# Patient Record
Sex: Female | Born: 1960 | Race: White | Hispanic: No | Smoking: Former smoker
Health system: Southern US, Community
[De-identification: ages and names within clinical notes are randomized; demographics above are authoritative.]

## PROBLEM LIST (undated history)

## (undated) DIAGNOSIS — R5383 Other fatigue: Secondary | ICD-10-CM

## (undated) DIAGNOSIS — K760 Fatty (change of) liver, not elsewhere classified: Secondary | ICD-10-CM

## (undated) DIAGNOSIS — L509 Urticaria, unspecified: Secondary | ICD-10-CM

## (undated) DIAGNOSIS — E669 Obesity, unspecified: Secondary | ICD-10-CM

## (undated) DIAGNOSIS — R002 Palpitations: Secondary | ICD-10-CM

## (undated) DIAGNOSIS — Z923 Personal history of irradiation: Secondary | ICD-10-CM

## (undated) DIAGNOSIS — Z9221 Personal history of antineoplastic chemotherapy: Secondary | ICD-10-CM

## (undated) DIAGNOSIS — R0683 Snoring: Secondary | ICD-10-CM

## (undated) DIAGNOSIS — E785 Hyperlipidemia, unspecified: Secondary | ICD-10-CM

## (undated) DIAGNOSIS — G4700 Insomnia, unspecified: Secondary | ICD-10-CM

## (undated) DIAGNOSIS — I10 Essential (primary) hypertension: Secondary | ICD-10-CM

## (undated) DIAGNOSIS — C50919 Malignant neoplasm of unspecified site of unspecified female breast: Secondary | ICD-10-CM

## (undated) DIAGNOSIS — R600 Localized edema: Secondary | ICD-10-CM

## (undated) DIAGNOSIS — D039 Melanoma in situ, unspecified: Secondary | ICD-10-CM

## (undated) DIAGNOSIS — Z460 Encounter for fitting and adjustment of spectacles and contact lenses: Secondary | ICD-10-CM

## (undated) DIAGNOSIS — R197 Diarrhea, unspecified: Secondary | ICD-10-CM

## (undated) HISTORY — DX: Localized edema: R60.0

## (undated) HISTORY — PX: BREAST BIOPSY: SHX20

## (undated) HISTORY — DX: Fatty (change of) liver, not elsewhere classified: K76.0

## (undated) HISTORY — DX: Malignant neoplasm of unspecified site of unspecified female breast: C50.919

## (undated) HISTORY — DX: Other fatigue: R53.83

## (undated) HISTORY — DX: Obesity, unspecified: E66.9

## (undated) HISTORY — PX: DILATION AND CURETTAGE OF UTERUS: SHX78

## (undated) HISTORY — DX: Urticaria, unspecified: L50.9

## (undated) HISTORY — DX: Hyperlipidemia, unspecified: E78.5

## (undated) HISTORY — DX: Diarrhea, unspecified: R19.7

## (undated) HISTORY — DX: Essential (primary) hypertension: I10

## (undated) HISTORY — PX: BREAST LUMPECTOMY: SHX2

## (undated) HISTORY — DX: Palpitations: R00.2

## (undated) HISTORY — DX: Insomnia, unspecified: G47.00

## (undated) HISTORY — PX: TUBAL LIGATION: SHX77

---
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
## 2001-10-05 ENCOUNTER — Encounter: Payer: Self-pay | Admitting: Emergency Medicine

## 2001-10-05 ENCOUNTER — Encounter (INDEPENDENT_AMBULATORY_CARE_PROVIDER_SITE_OTHER): Payer: Self-pay

## 2001-10-05 ENCOUNTER — Observation Stay (HOSPITAL_COMMUNITY): Admission: AD | Admit: 2001-10-05 | Discharge: 2001-10-06 | Payer: Self-pay | Admitting: Obstetrics and Gynecology

## 2001-11-13 HISTORY — PX: ECTOPIC PREGNANCY SURGERY: SHX613

## 2002-02-04 ENCOUNTER — Inpatient Hospital Stay (HOSPITAL_COMMUNITY): Admission: AD | Admit: 2002-02-04 | Discharge: 2002-02-04 | Payer: Self-pay | Admitting: Obstetrics and Gynecology

## 2002-02-26 ENCOUNTER — Encounter: Payer: Self-pay | Admitting: Family Medicine

## 2002-02-26 ENCOUNTER — Encounter: Admission: RE | Admit: 2002-02-26 | Discharge: 2002-02-26 | Payer: Self-pay | Admitting: Family Medicine

## 2002-03-18 ENCOUNTER — Encounter: Payer: Self-pay | Admitting: Obstetrics and Gynecology

## 2002-03-18 ENCOUNTER — Ambulatory Visit (HOSPITAL_COMMUNITY): Admission: RE | Admit: 2002-03-18 | Discharge: 2002-03-18 | Payer: Self-pay | Admitting: Obstetrics and Gynecology

## 2004-09-15 ENCOUNTER — Ambulatory Visit (HOSPITAL_COMMUNITY): Admission: RE | Admit: 2004-09-15 | Discharge: 2004-09-15 | Payer: Self-pay | Admitting: Family Medicine

## 2004-12-12 ENCOUNTER — Other Ambulatory Visit: Admission: RE | Admit: 2004-12-12 | Discharge: 2004-12-12 | Payer: Self-pay | Admitting: Obstetrics and Gynecology

## 2005-03-01 ENCOUNTER — Encounter: Admission: RE | Admit: 2005-03-01 | Discharge: 2005-03-01 | Payer: Self-pay | Admitting: Obstetrics and Gynecology

## 2006-11-13 HISTORY — PX: ABDOMINAL HYSTERECTOMY: SHX81

## 2007-05-01 ENCOUNTER — Ambulatory Visit (HOSPITAL_COMMUNITY): Admission: RE | Admit: 2007-05-01 | Discharge: 2007-05-02 | Payer: Self-pay | Admitting: Obstetrics and Gynecology

## 2007-05-01 ENCOUNTER — Encounter (INDEPENDENT_AMBULATORY_CARE_PROVIDER_SITE_OTHER): Payer: Self-pay | Admitting: Obstetrics and Gynecology

## 2007-06-28 ENCOUNTER — Encounter: Admission: RE | Admit: 2007-06-28 | Discharge: 2007-06-28 | Payer: Self-pay | Admitting: Cardiology

## 2011-03-31 NOTE — H&P (Signed)
NAMEVEVERLY, LARIMER                ACCOUNT NO.:  [id number]   MEDICAL RECORD NO.:  [id number]          PATIENT TYPE:  AMB   LOCATION:  SDC                           FACILITY:  [facility]   PHYSICIAN:  Duke Salvia. Marcelle Overlie, M.D.DATE OF BIRTH:  1961-03-26   DATE OF ADMISSION:  DATE OF DISCHARGE:                              HISTORY & PHYSICAL   CHIEF COMPLAINT:  Dysmenorrhea and menorrhagia.   HISTORY OF PRESENT ILLNESS:  This 50 year old G5, P2 with a history of  chronic pelvic pain, dysmenorrhea and menorrhagia presents for [facility] with  LSO, possible laparotomy with TAH/LSO.   This patient has a 1- to 2-year history of pelvic pain, worsening  menorrhagia and dysmenorrhea.  At the time of her laparoscopy, November  2002, for ruptured right ectopic pregnancy with right salpingectomy, she  was noted to have periadnexal adhesions on the left side with distal  left tubal adherence to the left tube and left pelvic sidewall.  She has  continued to be bothered with pelvic pain, mid and to the left, and  presents now for laparoscopy with LSH/ LSO, possible TAH/LSO, depending  on the operative findings.  This procedure including risks of bleeding,  infection, adjacent organ injury, transfusion, and expected recovery  time were all discussed with her, which she understands and accepts.   ALLERGIES:  ERYTHROMYCIN.   CURRENT MEDICATIONS:  Toprol, Norvasc, hydrochlorothiazide, Lipitor and  Nexium.   CARDIOLOGIST:  Dr. Deborah Chalk, treating her for hypertension.   PAST MEDICAL HISTORY:  Significant for family history of hypertension in  siblings and both parents and mother with coronary artery disease.   REVIEW OF SYSTEMS:  She has also had a tubal reversal,   PHYSICAL EXAMINATION:  VITAL SIGNS:  Temperature 98.2, blood pressure  120/80.  HEENT:  Unremarkable.  NECK:  Supple without masses.  LUNGS:  Clear.  CARDIOVASCULAR:  Regular rate and rhythm without murmurs, rubs, or  gallops noted.  BREASTS:   Without masses.  ABDOMEN:  Soft, flat and nontender.  PELVIC:  Normal external genitalia.  Vagina and cervix clear.  Uterus  mid-position and of normal size.  Adnexa unremarkable.  EXTREMITIES:  Unremarkable.   IMPRESSION:  1. Hypertension.  2. Dysmenorrhea with pelvic pain and menorrhagia.  3. History of tubal reversal, prior right salpingectomy and left      adnexal adhesions noted at her last laparoscopy.   PLAN:  Laparoscopy with LSH/LSO, possible TAH/LSO, procedure and risks  reviewed as above.      Richard M. Marcelle Overlie, M.D.  Electronically Signed     RMH/MEDQ  D:  04/30/2007  T:  04/30/2007  Job:  [id number]

## 2011-03-31 NOTE — Op Note (Signed)
[facility], [place]  Patient:    [name], [name] Visit Number: [id number] MRN: [id number]          Service Type: OBS Location: [id number] Attending Physician:  Rhina Brackett Dictated by:   Duke Salvia. Marcelle Overlie, M.D. Proc. Date: 10/06/01 Admit Date:  10/05/2001                             Operative Report  PREOPERATIVE DIAGNOSIS:       Rule out ectopic pregnancy.  POSTOPERATIVE DIAGNOSIS:      Ruptured right ectopic pregnancy.  PROCEDURES:                   1. Diagnostic laparoscopy.                               2. Right salpingectomy.  SURGEON:                      Duke Salvia. Marcelle Overlie, M.D.  ANESTHESIA:                   General endotracheal.  COMPLICATIONS:                None.  DRAINS:                       Foley catheter.  BLOOD LOSS:                   400.  PROCEDURE AND FINDINGS:       The patient was taken to the operating room. After an adequate level of general endotracheal anesthesia was obtained with the patients legs in stirrups, the abdomen, perineum and vagina were prepped in the usual manner for laparoscopy.  A Foley catheter was positioned. Attention was directed to the abdomen, where a 2 cm subumbilical incision was made and a Veress needle was introduced without difficulty.  Its intra-abdominal position was verified by pressure and water testing.  After a 2 L pneumoperitoneum was created, a laparoscopic trocar and sleeve were introduced without difficulty.  There was some old blood in the pelvis noted immediately.  Three fingerbreadths above the symphysis in the midline, a second puncture was made with a 5 mm trocar under direct visualization.  The Nezhat suction irrigator was positioned and suction irrigation was carried out.  There was an adherent clot mass with some tissue noted adherent to the fimbria of the right tube and right ovary.  This was removed in pieces through the upper port.  Inspection revealed clot and  tissue in this material.  It appeared that the ectopic pregnancy had ruptured through the distal tube. Once this was removed, further irrigation revealed that the left side showed some periadnexal adhesions, which were lysed, although the distal tube on the left was adherent to the left ovary, which was adherent to the left pelvic side wall and could never be identified specifically.  After removal of the ectopic tissue and irrigation, the right tube itself looked normal except there was moderately brisk bright red bleeding coming from the fimbriated end.  Dilute Pitressin solution, an ampule in 100 cc, was injected into the distal tube and mesosalpinx in an effort to get the bleeding to stop.  However, on observation, continued bleeding occurred.  The decision was made to proceed with right  salpingectomy due to the amount of bleeding.  Bipolar was used to coagulate the mesosalpinx and cut with coagulation of the tube also at the proximal end, which was then removed.  This area was irrigated.  The operative site was reinspected and noted to be hemostatic.  Prior to closure, sponge, needle and instrument counts were reported as correct x 2.  The instruments were removed.  Gas was allowed to escape.  The deep tissue was closed with 4-0 Dexon subcuticular sutures, Dermabond and Steri-Strips.  She went to the recovery room in good condition. Dictated by:   Duke Salvia. Marcelle Overlie, M.D. Attending Physician:  Rhina Brackett DD:  10/06/01 TD:  10/06/01 Job: [id number]

## 2011-03-31 NOTE — Discharge Summary (Signed)
[name], [name]                ACCOUNT NO.:  [id number]   MEDICAL RECORD NO.:  [id number]          PATIENT TYPE:  OIB   LOCATION:  9306                          FACILITY:  [facility]   PHYSICIAN:  Duke Salvia. Marcelle Overlie, M.D.DATE OF BIRTH:  [birth year]   DATE OF ADMISSION:  05/01/2007  DATE OF DISCHARGE:  05/02/2007                               DISCHARGE SUMMARY   DISCHARGE DIAGNOSES:  1. Menorrhagia, pelvic pain, dysmenorrhea.  2. LSH/Left salpingo-oophorectomy this admission.   SUMMARY OF THE HISTORY AND PHYSICAL EXAMINATION:  Please see admission  H&P for details.  Briefly, a 49 year old, G5, P2, with a history of  chronic pelvic pain, dysmenorrhea and menorrhagia who presents for [facility]  with possible LSO.   HOSPITAL COURSE:  On June 18 under general anesthesia, the patient  underwent LSH/LSO.  That afternoon, her postoperative hemoglobin was  12.3.  Catheter was removed that night, and she was voiding without  difficulty.  The following a.m., she was tolerating a regular diet, was  afebrile and was ready for discharge at that point with a normal  abdominal exam.  Her postoperative day #1 hemoglobin was 10.28.   Other laboratory data, CMET on admission normal except for sodium 133,  chloride 95, glucose 122.  Blood type was O+.  Pregnancy test negative,  antibody screen negative.  CBC on admission:  WBC 12.9, hemoglobin 14.7,  hematocrit 43.  On discharge, May 02, 2007, WBC 19.5, hemoglobin 10.8,  hematocrit 31.8.   DISPOSITION:  The patient was discharged on Tylox p.r.n. pain.  Will  resume her normal medications:  Hydrochlorothiazide, metoprolol,  Lipitor, Nexium and low-dose ASA daily.  She was advised to report any  incisional redness or drainage, increased pain or bleeding, or fever  over 101.  She was given specific instructions regarding diet, sex,  exercise.  Will return to the office in 7-10 days.   CONDITION:  Good.   ACTIVITY:  Graded increase.      Richard M.  Marcelle Overlie, M.D.  Electronically Signed     RMH/MEDQ  D:  05/02/2007  T:  05/02/2007  Job:  [id number]

## 2011-03-31 NOTE — Op Note (Signed)
[name], [name]                ACCOUNT NO.:  [id number]   MEDICAL RECORD NO.:  [id number]          PATIENT TYPE:  AMB   LOCATION:  SDC                           [facility]ILITY:  [facility]   PHYSICIAN:  [name], M.D.DATE OF BIRTH:  1960-11-28   DATE OF PROCEDURE:  05/01/2007  DATE OF DISCHARGE:                               OPERATIVE REPORT   PREOPERATIVE DIAGNOSIS:  Pelvic pain, menorrhagia.   POSTOPERATIVE DIAGNOSIS:  Pelvic pain, menorrhagia plus adnexal  adhesions.   PROCEDURE:  Laparoscopic supracervical hysterectomy with left salpingo-  oophorectomy, lysis of adhesions.   SURGEON:  Dr. Marcelle Cross   ASSISTANT:  Juluis Mire, M.D.   ANESTHESIA:  General endotracheal.   COMPLICATIONS:  None.   DRAINS:  Foley catheter.   BLOOD LOSS:  200 mL.   SPECIMENS REMOVED:  Uterine fundus, left tube and ovary.   PROCEDURE AND FINDINGS:  The patient was taken to the operating room and  after adequate level of general endotracheal anesthesia obtained, the  patient legs in stirrups.  The abdomen, perineum and vagina prepped and  draped in usual manner for laparoscopic procedures.  Foley catheter  positioned draining clear urine.  Attention directed to the abdominal  portion procedure.  The subumbilical area was infiltrated with 0.5%  Marcaine plain. Small incision was made and the Veress needle is  introduced without difficulty.  Its intra-abdominal position verified by  pressure and water testing.  After 2.5 liter pneumoperitoneum created,  laparoscopic trocar and sleeve were then introduced without difficulty.  Three fingerbreadths above the symphysis in the midline, a 5 mm trocar  was inserted under direct visualization.  The patient placed in  Trendelenburg and pelvic findings as follows.   The uterus itself was symmetrically enlarged approximately 8-10 weeks  size.  The right tube was surgically absent.  The right ovary appeared  to be normal.  There were some filmy left  adnexal adhesions. Upper  abdomen was otherwise unremarkable.  Starting in the left lower quadrant  a bladeless 10 mm trocar was inserted in the left lower quadrant and the  same on the right after negative transillumination.  The assistant  placed the left tube and ovary on traction toward the midline.  The  course of the left ureter was noted to be well below the operative site.  The left IP ligament was then coagulated and divided with the harmonic  ace instrument down to and including the round ligament. With better  exposure then the peritoneum was carried around anterior uterus to the  opposite side.  This was hemostatic and the assistant on the upside  dissected free the utero-ovarian pedicle on that side with a harmonic  Ace and down to and including the round ligament.  The right ovary was  thus conserved.  The dissection anteriorly was completed from that side  to the midline and with minimal blunt dissection the bladder was  dissected below.  The ascending branch of the uterine artery on the  right side was coagulated on minimal power and divided with good  hemostasis.  The exact same repeated  on the opposite side. Using the  harmonic Ace the fundus was then dissected free at the cervical isthmus.  The cervical isthmus stump was hemostatic.  The morcellator was  introduced into the left lower incision and morcellation was carried out  of tube and ovary separately and the remainder of the uterine fundus.  Copious irrigation was then carried out.  Reinspection for any tissue  fragments and there were none.  This point the harmonic Ace was then  used to endocoagulate the endocervical canal. Further irrigation  enlarged the pressure revealed excellent hemostasis.  The Interceed was  placed across the cervical stump.  Instruments removed, gas allowed to  escape.  Defects closed with 4-0 Dexon subcuticular sutures and  Dermabond.  The left lower incision where the morcellator had been  the  fascia was approximated with 2-0 Vicryl on a UR needle.  The patient  tolerated this well.  Clear urine noted at end of the case.  She  tolerated this well, went to recovery room in good condition.      [name], M.D.  Electronically Signed     RMH/MEDQ  D:  05/01/2007  T:  05/01/2007  Job:  [id number]

## 2011-04-17 ENCOUNTER — Observation Stay (HOSPITAL_COMMUNITY)
Admission: EM | Admit: 2011-04-17 | Discharge: 2011-04-19 | Disposition: A | Discharge status: Home / Self Care | Payer: BC Managed Care – PPO | Attending: Hospitalist | Admitting: Hospitalist

## 2011-04-17 DIAGNOSIS — K449 Diaphragmatic hernia without obstruction or gangrene: Secondary | ICD-10-CM | POA: Insufficient documentation

## 2011-04-17 DIAGNOSIS — Z01810 Encounter for preprocedural cardiovascular examination: Secondary | ICD-10-CM | POA: Insufficient documentation

## 2011-04-17 DIAGNOSIS — I10 Essential (primary) hypertension: Secondary | ICD-10-CM | POA: Insufficient documentation

## 2011-04-17 DIAGNOSIS — R0789 Other chest pain: Secondary | ICD-10-CM | POA: Insufficient documentation

## 2011-04-17 DIAGNOSIS — R079 Chest pain, unspecified: Principal | ICD-10-CM | POA: Insufficient documentation

## 2011-04-17 DIAGNOSIS — R0602 Shortness of breath: Secondary | ICD-10-CM | POA: Insufficient documentation

## 2011-04-17 DIAGNOSIS — E785 Hyperlipidemia, unspecified: Secondary | ICD-10-CM | POA: Insufficient documentation

## 2011-04-18 ENCOUNTER — Emergency Department (HOSPITAL_COMMUNITY): Payer: BC Managed Care – PPO

## 2011-04-18 LAB — COMPREHENSIVE METABOLIC PANEL
AST: 47 U/L — ABNORMAL HIGH (ref 0–37)
Albumin: 3.3 g/dL — ABNORMAL LOW (ref 3.5–5.2)
Alkaline Phosphatase: 89 U/L (ref 39–117)
Chloride: 104 mEq/L (ref 96–112)
Creatinine, Ser: 0.64 mg/dL (ref 0.4–1.2)
GFR calc Af Amer: >60 mL/min (ref >60)
Potassium: 3.5 mEq/L (ref 3.5–5.1)
Total Bilirubin: 0.2 mg/dL — ABNORMAL LOW (ref 0.3–1.2)
Total Protein: 7 g/dL (ref 6.0–8.3)

## 2011-04-18 LAB — CBC
HCT: 35 % — ABNORMAL LOW (ref 36.0–46.0)
HCT: 38.9 % (ref 36.0–46.0)
Hemoglobin: 12.1 g/dL (ref 12.0–15.0)
MCH: 29.7 pg (ref 26.0–34.0)
MCHC: 34.6 g/dL (ref 30.0–36.0)
MCV: 86.1 fL (ref 78.0–100.0)
RDW: 13 % (ref 11.5–15.5)
WBC: 11.6 10*3/uL — ABNORMAL HIGH (ref 4.0–10.5)

## 2011-04-18 LAB — CK TOTAL AND CKMB (NOT AT ARMC)
CK, MB: 1.6 ng/mL (ref 0.3–4.0)
Relative Index: 1.8 (ref 0.0–2.5)
Total CK: 110 U/L (ref 7–177)

## 2011-04-18 LAB — LIPASE, BLOOD: Lipase: 60 U/L — ABNORMAL HIGH (ref 11–59)

## 2011-04-18 LAB — POCT I-STAT, CHEM 8
Chloride: 102 mEq/L (ref 96–112)
Creatinine, Ser: 1.3 mg/dL — ABNORMAL HIGH (ref 0.4–1.2)
Glucose, Bld: 139 mg/dL — ABNORMAL HIGH (ref 70–99)
HCT: 42 % (ref 36.0–46.0)
Potassium: 3 mEq/L — ABNORMAL LOW (ref 3.5–5.1)

## 2011-04-18 LAB — URINALYSIS, ROUTINE W REFLEX MICROSCOPIC
Bilirubin Urine: NEGATIVE
Ketones, ur: NEGATIVE mg/dL
Nitrite: NEGATIVE
Protein, ur: NEGATIVE mg/dL
Urobilinogen, UA: 0.2 mg/dL (ref 0.0–1.0)

## 2011-04-18 LAB — DIFFERENTIAL
Eosinophils Relative: 3 % (ref 0–5)
Lymphocytes Relative: 42 % (ref 12–46)
Lymphs Abs: 4.9 10*3/uL — ABNORMAL HIGH (ref 0.7–4.0)

## 2011-04-18 LAB — TROPONIN I
Troponin I: <0.3 ng/mL (ref <0.30)
Troponin I: <0.3 ng/mL (ref <0.30)

## 2011-04-18 LAB — LIPID PANEL
Total CHOL/HDL Ratio: 3.1 RATIO
Triglycerides: 127 mg/dL (ref <150)
VLDL: 25 mg/dL (ref 0–40)

## 2011-04-18 LAB — APTT: aPTT: 28 seconds (ref 24–37)

## 2011-04-19 DIAGNOSIS — R072 Precordial pain: Secondary | ICD-10-CM

## 2011-04-19 LAB — CBC
HCT: 36.2 % (ref 36.0–46.0)
Hemoglobin: 12.5 g/dL (ref 12.0–15.0)
MCH: 30.2 pg (ref 26.0–34.0)
MCHC: 34.5 g/dL (ref 30.0–36.0)
MCV: 87.4 fL (ref 78.0–100.0)
RBC: 4.14 MIL/uL (ref 3.87–5.11)

## 2011-04-19 LAB — DIFFERENTIAL
Basophils Relative: 1 % (ref 0–1)
Lymphs Abs: 4.4 10*3/uL — ABNORMAL HIGH (ref 0.7–4.0)
Monocytes Absolute: 0.9 10*3/uL (ref 0.1–1.0)
Monocytes Relative: 7 % (ref 3–12)
Neutro Abs: 6 10*3/uL (ref 1.7–7.7)

## 2011-04-19 LAB — BASIC METABOLIC PANEL
Chloride: 102 mEq/L (ref 96–112)
GFR calc non Af Amer: >60 mL/min (ref >60)
Glucose, Bld: 117 mg/dL — ABNORMAL HIGH (ref 70–99)
Potassium: 4.7 mEq/L (ref 3.5–5.1)
Sodium: 139 mEq/L (ref 135–145)

## 2011-04-19 LAB — MAGNESIUM: Magnesium: 2.1 mg/dL (ref 1.5–2.5)

## 2011-04-21 NOTE — Discharge Summary (Signed)
  [name], [name] NO.:  [id number]  MEDICAL RECORD NO.:  [id number]  LOCATION:  3705                         FACILITY:  [facility]  PHYSICIAN:  Sundra Aland, MD      DATE OF BIRTH:  09-18-1961  DATE OF ADMISSION:  04/17/2011 DATE OF DISCHARGE:  04/19/2011                              DISCHARGE SUMMARY   DISCHARGE DISPOSITION:  Home.  DISCHARGE DIAGNOSES: 1. Atypical chest pain. 2. Hypertension. 3. Hyperlipidemia. 4. Possible urinary tract infection. 5. Hiatal hernia.  DISCHARGE MEDICATIONS:  The patient will continue on oral medications prior to admission.  New medication given is Cipro 250 mg p.o. b.i.d. for 1 week.  PROCEDURES: 1. Chest x-ray reported as no evidence of active pulmonary disease. 2. A 2-D echocardiogram will be done before she leaves.  HOSPITAL COURSE:  Ms. [name] is a 50 year old Caucasian female with history of hypertension, hyperlipidemia, __________ coronary artery disease.  She also does have gastroesophageal reflux disease and hiatal hernia.  She presented to the ED with chest pain concerning for unstable angina.  She has negative cardiac enzymes.  Her EKG is normal.  She has also got an echo this morning and did have an outpatient stress test. She does state she had stress test back in December 2007, which was essentially normal.  Her vital signs this morning are stable blood pressure is 105/69, heart rate 58, respirations 18, saturating 98% on room air, temperature is 97.5.  She will, therefore, be discharged in stable clinical condition.  DISCHARGE DISPOSITION:  Home.  DISCHARGE DIET:  Heart-healthy diet.  DISCHARGE FOLLOWUP:  The patient will need to follow up with primary care physician, Dr. Marjory Lies at [facility].  This doctor will follow up with her echo report as well as outpatient followup with her cardiologist for an outpatient stress test.     Sundra Aland, MD     LA/MEDQ   D:  04/19/2011  T:  04/20/2011  Job:  [id number]  Electronically Signed by Sundra Aland MD on 04/21/2011 02:56:02 PM

## 2011-05-08 NOTE — H&P (Signed)
NAME:  [name], [name] NO.:  [id number]  MEDICAL RECORD NO.:  [id number]  LOCATION:  [facility]                         FACILITY:  [facility]  PHYSICIAN:  Eduard Clos, MDDATE OF BIRTH:  October 10, 1961  DATE OF ADMISSION:  04/17/2011 DATE OF DISCHARGE:                             HISTORY & PHYSICAL   PRIMARY CARE PHYSICIAN:  Marjory Lies, MD at [facility].  CHIEF COMPLAINT:  Chest pain.  HISTORY OF PRESENTING ILLNESS:  A 50 year old female with a history of hypertension, hyperlipidemia, who experienced some chest pain last night while watching TV, left anterior chest wall, radiating to her neck, pressure like, not relieved by exertion, has some mild shortness of breath.  The patient has been getting off and on palpitation.  In the ER, the patient had EKG, and cardiac enzymes at this time is negative. The patient has been admitted for further workup.  The patient states that she did have palpitation off and on, and has been seen by Dr. Roger Shelter with __________ and was told that her palpitation was benign.  The patient also had a stress test 4 years ago which was negative.  The patient denies any nausea, vomiting, abdominal pain, dysuria, discharge, or diarrhea.  Denies any cough or phlegm.  Denies any fever or chills.  Denies any dizziness, loss of consciousness, or any focal deficit.  PAST MEDICAL HISTORY:  Hypertension, hyperlipidemia.  PAST SURGICAL HISTORY:  Hysterectomy.  MEDICATIONS PRIOR TO ADMISSION:  Aspirin; hydrochlorothiazide; multivitamin; Norvasc; Toprol-XL; Crestor, doses of which has to be verified.  ALLERGIES:  ZITHROMAX.  FAMILY HISTORY:  Significant for the patient's dad having MI at age 66 and her brother recently diagnosed with some blood cancer.  SOCIAL HISTORY:  The patient works as Geophysicist/field seismologist principal in a school. Denies smoking cigarettes, drinking alcohol, or using illegal drugs. She is married, lives  with her husband.  She is a full code.  REVIEW OF SYSTEMS:  As per history of presenting illness, nothing else significant.  PHYSICAL EXAMINATION:  GENERAL:  The patient was examined at the bedside, not in acute distress. VITAL SIGNS:  Blood pressure 130/64, pulse 60 per minute, temperature 97.9, respirations 18 per minute, O2 sat 100%. HEENT:  Anicteric.  No pallor.  No discharge from ears, eyes, nose, or mouth. CHEST:  Bilateral air entry present.  No rhonchi.  No crepitation. HEART:  S1 and S2 heard. ABDOMEN:  Soft, nontender.  Bowel sounds heard. CNS:  Alert, awake, and oriented to time, place, and person.  Moves upper and lower extremities 5/5. EXTREMITIES:  Peripheral pulses felt.  No edema.  LABS:  EKG shows normal sinus rhythm, heart rate is around 65 beats per minute with nonspecific ST-T changes.  Chest x-ray shows no evidence of active pulmonary disease.  CBC; WBC 11.6, hemoglobin 14.3, hematocrit is 42, platelets 230.  PT/INR is 12 and 0.87.  Basic metabolic panel; sodium 139, potassium 3, chloride 102, carbon dioxide is 26, glucose 139, BUN 18, creatinine 1.3.  CK is 110, CK-MB is 2.  Relative index is 1.8, troponin I less than 0.3.  ASSESSMENT: 1. Chest pain to rule out acute coronary syndrome. 2. History of hypertension.  3. History of hyperlipidemia. 4. Mild leukocytosis. 5. History of palpitations.  PLAN: 1. At this time, we will admit the patient to telemetry. 2. For her chest pain, we are going to cycle cardiac markers.  The     patient [place] on aspirin and nitroglycerin.  We will get a 2D echo.     We are going to check LFT, lipase, and a D-dimer.  If D-dimer is     high, we are going to get a CT angio of the chest.  The patient did     have a stress test 4 years ago and the patient stated it was     negative for any ischemia. 3. We need to verify all medications and restart which was     appropriate. 4. Mild leukocytosis.  We are going to recheck CBC in  a.m.  I am also     going to get a UA at this time.  We will closely follow her WBC     counts and further recommendation as condition evolves.     Eduard Clos, MD     ANK/MEDQ  D:  04/18/2011  T:  04/18/2011  Job:  [id number]  cc:   Marjory Lies, M.D.  Electronically Signed by Midge Minium MD on 05/08/2011 07:24:12 AM

## 2011-05-10 ENCOUNTER — Other Ambulatory Visit: Payer: Self-pay | Admitting: *Deleted

## 2011-05-10 ENCOUNTER — Other Ambulatory Visit: Payer: Self-pay | Admitting: Nurse Practitioner

## 2011-05-10 MED ORDER — METOPROLOL SUCCINATE ER 50 MG PO TB24: 50.0000 mg | ORAL_TABLET | Freq: Two times a day (BID) | ORAL | Status: DC

## 2011-05-15 ENCOUNTER — Other Ambulatory Visit: Payer: Self-pay | Admitting: Obstetrics and Gynecology

## 2011-05-15 DIAGNOSIS — R928 Other abnormal and inconclusive findings on diagnostic imaging of breast: Secondary | ICD-10-CM

## 2011-05-26 ENCOUNTER — Other Ambulatory Visit: Payer: Self-pay | Admitting: *Deleted

## 2011-05-26 ENCOUNTER — Ambulatory Visit
Admission: RE | Admit: 2011-05-26 | Discharge: 2011-05-26 | Disposition: A | Discharge status: Home / Self Care | Payer: BC Managed Care – PPO | Source: Ambulatory Visit | Attending: Obstetrics and Gynecology | Admitting: Obstetrics and Gynecology

## 2011-05-26 DIAGNOSIS — R928 Other abnormal and inconclusive findings on diagnostic imaging of breast: Secondary | ICD-10-CM

## 2011-05-26 MED ORDER — PANTOPRAZOLE SODIUM 40 MG PO TBEC: 40.0000 mg | DELAYED_RELEASE_TABLET | Freq: Every day | ORAL | Status: DC

## 2011-05-26 NOTE — Telephone Encounter (Signed)
Fax received from pharmacy. Refill completed. Jodette Alisah Grandberry RN  

## 2011-06-24 ENCOUNTER — Other Ambulatory Visit: Payer: Self-pay | Admitting: Nurse Practitioner

## 2011-06-26 NOTE — Telephone Encounter (Signed)
escribe medication per fax request  

## 2011-08-07 ENCOUNTER — Other Ambulatory Visit: Payer: Self-pay | Admitting: *Deleted

## 2011-08-07 MED ORDER — HYDROCHLOROTHIAZIDE 25 MG PO TABS: 25.0000 mg | ORAL_TABLET | Freq: Every day | ORAL | Status: DC

## 2011-08-07 NOTE — Telephone Encounter (Signed)
Refilled Meds from fax  

## 2011-08-30 LAB — CBC
HCT: 31.8 — ABNORMAL LOW
Hemoglobin: 10.8 — ABNORMAL LOW
MCHC: 34.2
MCV: 86.4
Platelets: 366
Platelets: 376
Platelets: 438 — ABNORMAL HIGH
RBC: 3.66 — ABNORMAL LOW
RDW: 13.6
WBC: 18.6 — ABNORMAL HIGH
WBC: 19.5 — ABNORMAL HIGH

## 2011-08-30 LAB — BASIC METABOLIC PANEL
BUN: 9
CO2: 29
Calcium: 9.9
Creatinine, Ser: 0.59
Glucose, Bld: 122 — ABNORMAL HIGH

## 2011-08-30 LAB — TYPE AND SCREEN: ABO/RH(D): O POS

## 2011-08-30 LAB — ABO/RH: ABO/RH(D): O POS

## 2011-08-30 LAB — PREGNANCY, URINE: Preg Test, Ur: NEGATIVE

## 2011-11-23 ENCOUNTER — Other Ambulatory Visit: Payer: Self-pay | Admitting: Obstetrics and Gynecology

## 2011-11-23 DIAGNOSIS — R921 Mammographic calcification found on diagnostic imaging of breast: Secondary | ICD-10-CM

## 2011-11-30 ENCOUNTER — Ambulatory Visit
Admission: RE | Admit: 2011-11-30 | Discharge: 2011-11-30 | Disposition: A | Discharge status: Home / Self Care | Payer: BC Managed Care – PPO | Source: Ambulatory Visit | Attending: Obstetrics and Gynecology | Admitting: Obstetrics and Gynecology

## 2011-11-30 DIAGNOSIS — R921 Mammographic calcification found on diagnostic imaging of breast: Secondary | ICD-10-CM

## 2012-01-09 ENCOUNTER — Other Ambulatory Visit: Payer: Self-pay | Admitting: Nurse Practitioner

## 2012-01-11 ENCOUNTER — Other Ambulatory Visit: Payer: Self-pay | Admitting: Nurse Practitioner

## 2012-05-31 ENCOUNTER — Ambulatory Visit: Payer: BC Managed Care – PPO | Admitting: *Deleted

## 2012-05-31 IMAGING — MG MM DIGITAL DIAGNOSTIC UNILAT R {BCG}
2 series · 2 of 2 positions shown · non-contrast
Comparison: 05/09/2011 and earlier

CLINICAL DATA: The patient returns after screening study for
evaluation of calcifications in the right breast.

DIGITAL DIAGNOSTIC RIGHT MAMMOGRAM

[R CC]
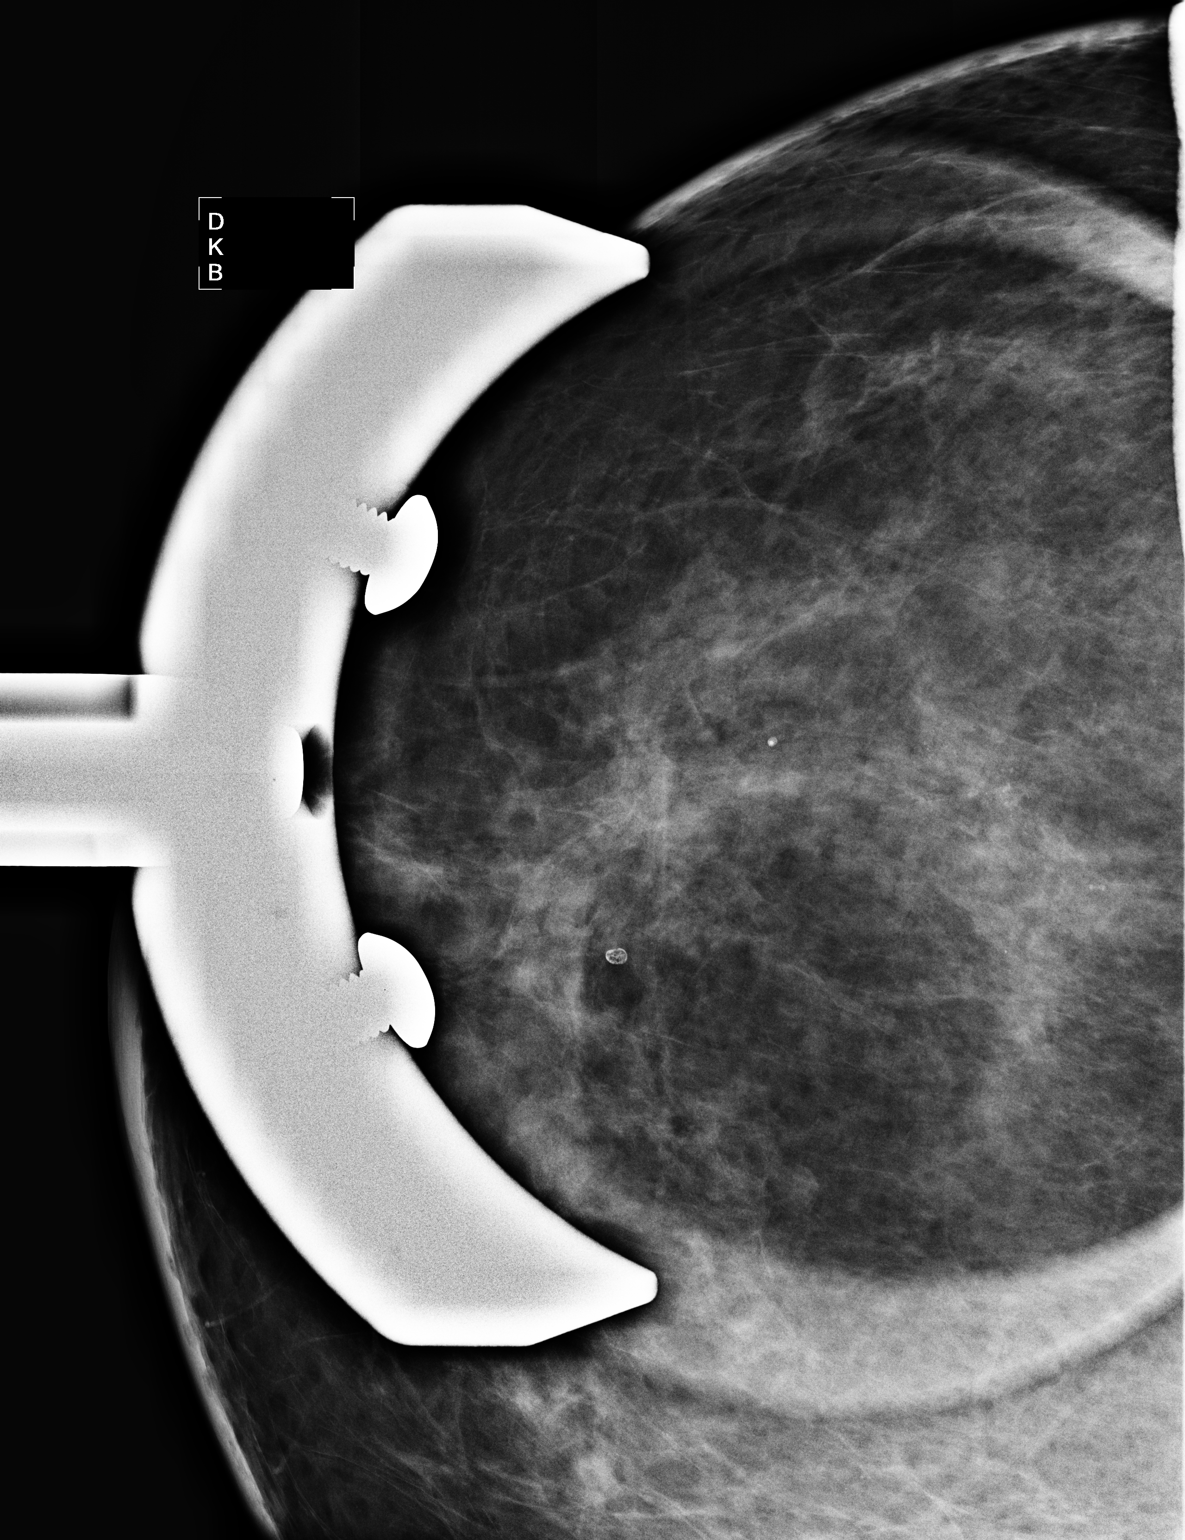

[R ML]
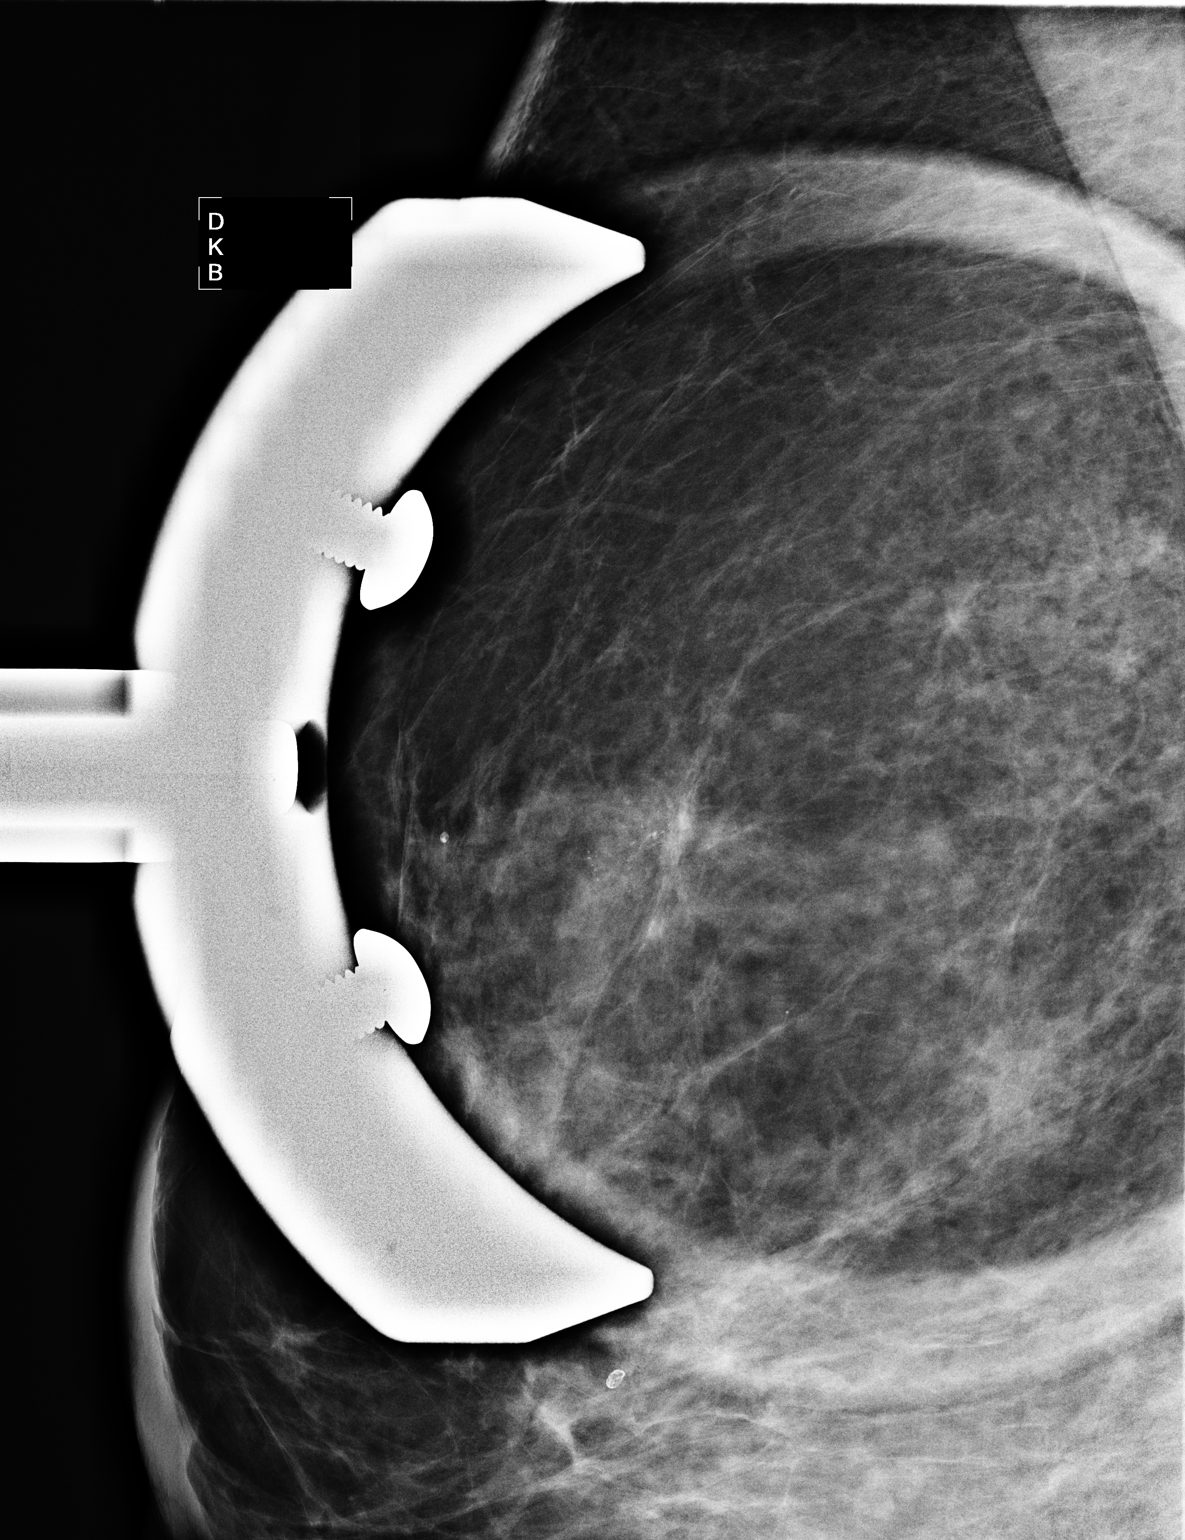

[2 of 2 positions shown; findings below may reference images not displayed]

FINDINGS: Magnified views are performed of calcifications in the
right breast.  Calcifications appear faint on the craniocaudal view
and show layering on true lateral view, consistent with benign,
milk of calcium type calcifications.  No suspicious morphology or
distribution of calcifications.
IMPRESSION: Calcifications in the right breast have a benign appearance.
Follow-up right mammogram with magnified views is suggested in 6
months.

BI-RADS CATEGORY 3:  Probably benign finding(s) - short interval
follow-up suggested.

## 2012-06-03 ENCOUNTER — Other Ambulatory Visit: Payer: Self-pay | Admitting: Obstetrics and Gynecology

## 2012-06-03 DIAGNOSIS — R921 Mammographic calcification found on diagnostic imaging of breast: Secondary | ICD-10-CM

## 2012-06-11 ENCOUNTER — Ambulatory Visit
Admission: RE | Admit: 2012-06-11 | Discharge: 2012-06-11 | Disposition: A | Discharge status: Home / Self Care | Payer: BC Managed Care – PPO | Source: Ambulatory Visit | Attending: Obstetrics and Gynecology | Admitting: Obstetrics and Gynecology

## 2012-06-11 ENCOUNTER — Other Ambulatory Visit: Payer: Self-pay | Admitting: Obstetrics and Gynecology

## 2012-06-11 DIAGNOSIS — R921 Mammographic calcification found on diagnostic imaging of breast: Secondary | ICD-10-CM

## 2012-06-11 DIAGNOSIS — C50919 Malignant neoplasm of unspecified site of unspecified female breast: Secondary | ICD-10-CM

## 2012-06-11 HISTORY — DX: Malignant neoplasm of unspecified site of unspecified female breast: C50.919

## 2012-06-12 ENCOUNTER — Other Ambulatory Visit: Payer: Self-pay | Admitting: Obstetrics and Gynecology

## 2012-06-12 DIAGNOSIS — C50911 Malignant neoplasm of unspecified site of right female breast: Secondary | ICD-10-CM

## 2012-06-13 ENCOUNTER — Telehealth: Payer: Self-pay | Admitting: *Deleted

## 2012-06-13 ENCOUNTER — Other Ambulatory Visit: Payer: Self-pay | Admitting: *Deleted

## 2012-06-13 DIAGNOSIS — C50419 Malignant neoplasm of upper-outer quadrant of unspecified female breast: Secondary | ICD-10-CM

## 2012-06-13 DIAGNOSIS — C50411 Malignant neoplasm of upper-outer quadrant of right female breast: Secondary | ICD-10-CM | POA: Insufficient documentation

## 2012-06-13 NOTE — Telephone Encounter (Signed)
Confirmed [facility] for 8/7/13at 0800 .  Instructions and contact information given.  

## 2012-06-18 ENCOUNTER — Ambulatory Visit
Admission: RE | Admit: 2012-06-18 | Discharge: 2012-06-18 | Disposition: A | Discharge status: Home / Self Care | Payer: BC Managed Care – PPO | Source: Ambulatory Visit | Attending: Obstetrics and Gynecology | Admitting: Obstetrics and Gynecology

## 2012-06-18 DIAGNOSIS — C50911 Malignant neoplasm of unspecified site of right female breast: Secondary | ICD-10-CM

## 2012-06-18 MED ORDER — GADOBENATE DIMEGLUMINE 529 MG/ML IV SOLN
18.0000 mL | Freq: Once | INTRAVENOUS | Status: AC | PRN
  Administered 2012-06-18: 18 mL via INTRAVENOUS

## 2012-06-19 ENCOUNTER — Ambulatory Visit: Payer: BC Managed Care – PPO

## 2012-06-19 ENCOUNTER — Encounter: Payer: Self-pay | Admitting: *Deleted

## 2012-06-19 ENCOUNTER — Ambulatory Visit: Payer: BC Managed Care – PPO | Attending: Surgery | Admitting: Physical Therapy

## 2012-06-19 ENCOUNTER — Encounter (INDEPENDENT_AMBULATORY_CARE_PROVIDER_SITE_OTHER): Payer: Self-pay | Admitting: Surgery

## 2012-06-19 ENCOUNTER — Ambulatory Visit
Admission: RE | Admit: 2012-06-19 | Discharge: 2012-06-19 | Disposition: A | Discharge status: Home / Self Care | Payer: BC Managed Care – PPO | Source: Ambulatory Visit | Attending: Radiation Oncology | Admitting: Radiation Oncology

## 2012-06-19 ENCOUNTER — Ambulatory Visit (HOSPITAL_BASED_OUTPATIENT_CLINIC_OR_DEPARTMENT_OTHER): Payer: BC Managed Care – PPO | Admitting: Surgery

## 2012-06-19 ENCOUNTER — Ambulatory Visit (HOSPITAL_BASED_OUTPATIENT_CLINIC_OR_DEPARTMENT_OTHER): Payer: BC Managed Care – PPO | Admitting: Oncology

## 2012-06-19 ENCOUNTER — Encounter (HOSPITAL_BASED_OUTPATIENT_CLINIC_OR_DEPARTMENT_OTHER): Payer: Self-pay | Admitting: *Deleted

## 2012-06-19 ENCOUNTER — Other Ambulatory Visit (HOSPITAL_BASED_OUTPATIENT_CLINIC_OR_DEPARTMENT_OTHER): Payer: BC Managed Care – PPO | Admitting: Lab

## 2012-06-19 ENCOUNTER — Other Ambulatory Visit (INDEPENDENT_AMBULATORY_CARE_PROVIDER_SITE_OTHER): Payer: Self-pay | Admitting: Surgery

## 2012-06-19 VITALS — BP 131/71 | HR 66 | Temp 97.8°F | Resp 20 | Ht 66.2 in | Wt 199.6 lb

## 2012-06-19 VITALS — BP 131/71 | HR 66 | Temp 97.8°F | Resp 20 | Ht 66.2 in | Wt 200.0 lb

## 2012-06-19 DIAGNOSIS — Z170 Estrogen receptor positive status [ER+]: Secondary | ICD-10-CM

## 2012-06-19 DIAGNOSIS — D0590 Unspecified type of carcinoma in situ of unspecified breast: Secondary | ICD-10-CM

## 2012-06-19 DIAGNOSIS — E669 Obesity, unspecified: Secondary | ICD-10-CM

## 2012-06-19 DIAGNOSIS — C50419 Malignant neoplasm of upper-outer quadrant of unspecified female breast: Secondary | ICD-10-CM

## 2012-06-19 DIAGNOSIS — E119 Type 2 diabetes mellitus without complications: Secondary | ICD-10-CM

## 2012-06-19 LAB — COMPREHENSIVE METABOLIC PANEL
ALT: 24 U/L (ref 0–35)
Albumin: 3.7 g/dL (ref 3.5–5.2)
BUN: 16 mg/dL (ref 6–23)
CO2: 28 mEq/L (ref 19–32)
Calcium: 10.4 mg/dL (ref 8.4–10.5)
Chloride: 92 mEq/L — ABNORMAL LOW (ref 96–112)
Creatinine, Ser: 0.68 mg/dL (ref 0.50–1.10)

## 2012-06-19 LAB — CBC WITH DIFFERENTIAL/PLATELET
Basophils Absolute: 0.1 10*3/uL (ref 0.0–0.1)
Eosinophils Absolute: 0.1 10*3/uL (ref 0.0–0.5)
HCT: 40.4 % (ref 34.8–46.6)
HGB: 13.7 g/dL (ref 11.6–15.9)
MONO#: 0.7 10*3/uL (ref 0.1–0.9)
NEUT#: 5.7 10*3/uL (ref 1.5–6.5)
NEUT%: 57.4 % (ref 38.4–76.8)
WBC: 9.9 10*3/uL (ref 3.9–10.3)
lymph#: 3.3 10*3/uL (ref 0.9–3.3)

## 2012-06-19 NOTE — Progress Notes (Signed)
[facility]-will need new ekg on arrival-last one 6/12 Had workup 6/12 for chest pain-echo done-all test neg-released to pcp-dr tammy spears Hard iv stick-to hydrate well-and needs warm blanket to warm arms

## 2012-06-19 NOTE — Progress Notes (Signed)
[name] [id number] 01-Mar-1961 51 y.o. 06/19/2012 12:26 PM  CC Dr. Richarda Overlie   REASON FOR CONSULTATION: Breast cancer  Patient was seen in the Multidisciplinary Breast Clinic for discussion of her treatment options. She was seen by Dr. Pierce Crane, Radiation Onc[facility]  STAGE:   Cancer of upper-outer quadrant of female breast   Primary site: Breast (Right)   Staging method: AJCC 7th Edition   Clinical: Stage 0 (Tis (DCIS), N0, cM0)   Summary: Stage 0 (Tis (DCIS), N0, cM0)  REFERRING PHYSICIAN: Dr. Richarda Overlie  HISTORY OF PRESENT ILLNESS:  [name] is a 51 y.o. female.   from [facility], who presents with abnormal mammogram. She has undergone annual screening mammography. She had a screening study which was abnormal and was referred for diagnostic study of the right breast. This took place 05/26/2011. This showed calcifications in the right breast. A followup mammogram January 2013 was also felt to be benign. Subsequent mammogram 06/11/2012 showed increase in the number of pleomorphic calcifications upper-outer quadrant right breast. Biopsy was recommended. 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 Rise scan both breasts 06/18/2012 showed a area of nodular enhancement upper-outer quadrant measuring 1.6 x 1.2 x 1 cm. No other abnormalities were seen.   Past Medical History: Past Medical History  Diagnosis Date  . Hypertension   . Palpitations   . Ectopic pregnancy     x1  . Obesity   . Hyperlipidemia   . Fatigue   . Diarrhea   . Insomnia   . Lower extremity edema     Resolved  . Hives     Etiology questionable  . Fatty liver     History of Ftty Liver  . Breast cancer   . Diabetes mellitus     Past Surgical History: Past Surgical History  Procedure Date  . Tubal ligation     At age 16  . Abdominal hysterectomy, ovaries left in  She has had cryosurgery for what  sounds like cervical dysplasia 1983  She also had one ectopic pregnancy requiring a dose of IV methotrexate to follow.      Family History: Family History  Problem Relation Age of Onset  . Heart failure Mother   . Heart attack Mother   . Heart attack Father    she has 6 brothers 4 sisters, one brother died of throat cancer at age 31 prior to that had a history of Hodgkin's is no other history of breast or ovarian cancer in the family   Social History History  Substance Use Topics  . Smoking status: Former Smoker    Quit date: 01/22/1983  . Smokeless tobacco: Not on file  . Alcohol Use: No   she is currently married 14 years previously had was in the management 2 children currently ages 3127 she has 2 grandchildren. Her husband is a Furniture conservator/restorer at 1 high schools in [place]. 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   Allergies: Allergies  Allergen Reactions  . Erythromycin   . Zithromax (Azithromycin)     Current Medications: Current Outpatient Prescriptions  Medication Sig Dispense Refill  . amLODipine (NORVASC) 5 MG tablet Take 5 mg by mouth daily.        Marland Kitchen aspirin 81 MG tablet Take 81 mg by mouth daily.        . Cetirizine HCl (ZYRTEC PO) Take by mouth daily.        Marland Kitchen  Cholecalciferol (VITAMIN D PO) Take by mouth daily.        . colesevelam (WELCHOL) 625 MG tablet Take 1,875 mg by mouth 2 (two) times daily with a meal.      . cycloSPORINE (RESTASIS) 0.05 % ophthalmic emulsion Place 1 drop into both eyes 2 (two) times daily.      . fluticasone (FLONASE) 50 MCG/ACT nasal spray Place 2 sprays into the nose daily.      Marland Kitchen lactobacillus acidophilus (BACID) TABS Take 2 tablets by mouth 3 (three) times daily.      Marland Kitchen lisinopril-hydrochlorothiazide (PRINZIDE,ZESTORETIC) 10-12.5 MG per tablet Take 1 tablet by mouth daily.      . magnesium oxide (MAG-OX) 400 MG tablet Take 400 mg by mouth daily.      . metoprolol (TOPROL-XL) 50 MG 24 hr  tablet TAKE 1 TABLET BY MOUTH TWICE DAILY  60 tablet  0  . Multiple Vitamin (MULTIVITAMIN) tablet Take 1 tablet by mouth daily.        Marland Kitchen omega-3 acid ethyl esters (LOVAZA) 1 G capsule Take 2 g by mouth 2 (two) times daily.        Marland Kitchen omeprazole (PRILOSEC) 40 MG capsule Take 40 mg by mouth daily.      . rosuvastatin (CRESTOR) 10 MG tablet Take 10 mg by mouth daily.        . vitamin C (ASCORBIC ACID) 500 MG tablet Take 500 mg by mouth daily.      . hydrochlorothiazide (HYDRODIURIL) 25 MG tablet TAKE 1 TABLET BY MOUTH EVERY DAY  90 tablet  0  . pantoprazole (PROTONIX) 40 MG tablet Take 1 tablet (40 mg total) by mouth daily.  30 tablet  0    OB/GYN History G5 P2, she has had 2 ectopic pregnancies, tubal ligation in 1986 with reversal 2002. She hysterectomy 2008 secondary to adenomyosis and fibroids. She says her cervix in place as well as one ovary. She was in IVF program took hormones at that time  Fertility Discussion: NA Prior History of Cancer: no  Health Maintenance:  Colonoscopy yes Bone Density 2006 Last PAP smear July 2013  ECOG PERFORMANCE STATUS: 0 - Asymptomatic  Genetic Counseling/testing: no  REVIEW OF SYSTEMS:  A comprehensive review of systems was negative.  PHYSICAL EXAMINATION: Blood pressure 131/71, pulse 66, temperature 97.8 F (36.6 C), resp. rate 20, height 5' 6.2" (1.681 m), weight 199 lb 9.6 oz (90.538 kg).  HEENT:  Sclerae anicteric, conjunctivae pink.  Oropharynx clear.  No mucositis or candidiasis.  Nodes:  No cervical, supraclavicular, or axillary lymphadenopathy palpated.  Breast Exam:  Right breast is benign.  No masses, discharge, skin change, or nipple inversion.  Left breast is benign.  No masses, discharge, skin change, or nipple inversion..  Lungs:  Clear to auscultation bilaterally.  No crackles, rhonchi, or wheezes.  Heart:  Regular rate and rhythm.  Abdomen:  Soft, nontender.  Positive bowel sounds.  No organomegaly or masses palpated.   Musculoskeletal:  No focal spinal tenderness to palpation.  Extremities:  Benign.  No peripheral edema or cyanosis.  Skin:  Benign.  Neuro:  Nonfocal.     STUDIES/RESULTS: Mr Breast Bilateral W Wo Contrast  06/18/2012   *RADIOLOGY REPORT*  Clinical Data: Biopsy-proven high-grade DCIS manifesting as mammographically detected calcifications in the right upper outer quadrant.  BILATERAL BREAST MRI WITH AND WITHOUT CONTRAST  Technique: Multiplanar, multisequence MR images of both breasts were obtained prior to and following the intravenous administration of 18ml of Multihance.  Three dimensional images were evaluated at the independent DynaCad workstation.  Comparison:  Prior mammograms  Findings: Minimal post biopsy change and clip artifact are noted in the right upper outer quadrant at the site of biopsy-proven DCIS. Adjacent to the biopsy cavity, there is clumped nodular enhancement with plateau type enhancement kinetics measuring 1.6 x 1.2 x 1.0 cm.  This corresponds to the area of biopsy-proven DCIS.  No abnormal T2-weighted hyperintensity or lymphadenopathy is seen elsewhere in either side.  No other area of abnormal enhancement, allowing for moderate background type parenchymal enhancement pattern which may decrease the sensitivity for detection of malignancy.  IMPRESSION: Solitary abnormal clumped nodular enhancement right breast upper outer quadrant with central clip artifact, corresponding to biopsy- proven DCIS.  No MRI evidence for multifocal or multicentric or contralateral malignancy.  RECOMMENDATION: BI-RADS CATEGORY 6:  Known biopsy-proven malignancy - appropriate action should be taken.  Recommendation:  Treatment plan  THREE-DIMENSIONAL MR IMAGE RENDERING ON INDEPENDENT WORKSTATION:  Three-dimensional MR images were rendered by post-processing of the original MR data on an independent workstation.  The three- dimensional MR images were interpreted, and findings were reported in the accompanying  complete MRI report for this study.  Original Report Authenticated By: Harrel Lemon, M.D.   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  I spoke with the patient on 06/12/2012 at to 1320 hours.  She described mild pain at the biopsy site, controlled by acetaminophen.  She also described bruising in the right breast. She denied using from the incision site.  I informed her to contact me in the event that the pain became severe or if she began bleeding from the incision site.  The patient has been scheduled for the multidisciplinary Breast Clinic on Wednesday, August 7.  She will have breast MRI on Tuesday, August 6.  I telephoned these results to [facility], Dr. Dennie Bible nurse, on 06/13/2012 at 1030 hours.  Addended by:  Arnell Sieving, M.D.  **END ADDENDUM** SIGNED BY: Arnell Sieving, M.D.   06/11/2012    *RADIOLOGY REPORT*  Clinical Data:  Suspicious calcifications in the upper outer quadrant of the right breast.  STEREOTACTIC-GUIDED VACUUM ASSISTED BIOPSY OF THE RIGHT BREAST AND SPECIMEN RADIOGRAPH  Comparison: Diagnostic mammograms earlier same date and dating back to December, 2009.  I met with the patient and we discussed the procedure of stereotactic-guided biopsy, including benefits and alternatives. We discussed the high likelihood of a successful procedure. We discussed the risks of the procedure, including infection, bleeding, tissue injury, clip migration, and inadequate sampling. Informed, written consent was given.  Using sterile technique, 2% lidocaine, stereotactic guidance, and a 9 gauge vacuum assisted device, biopsy was performed of the calcifications in the upper outer quadrant of the right breast. Specimen radiograph was performed, showing calcifications in several of the cores.  At the conclusion of the procedure, an S-mark tissue marker clip was  deployed into the biopsy cavity.  Follow-up 2-view mammogram confirmed clip placement in the appropriate position in the upper outer quadrant of the right breast in the area of calcifications.  IMPRESSION: Stereotactic-guided biopsy of calcifications in the upper outer quadrant of the right breast.  No apparent complications.  This was discussed with Dr. Marcelle Overlie by telephone on 06/11/2012 at 1530 hours.  Original Report Authenticated By: Arnell Sieving, M.D.   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  I spoke with the patient on 06/12/2012 at to 1320 hours.  She described mild pain at the biopsy site, controlled by acetaminophen.  She also described bruising in the right breast. She denied using from the incision site.  I informed her to contact me in the event that the pain became severe or if she began bleeding from the incision site.  The patient has been scheduled for the multidisciplinary Breast Clinic on Wednesday, August 7.  She will have breast MRI on Tuesday, August 6.  I telephoned these results to [facility], Dr. Dennie Bible nurse, on 06/13/2012 at 1030 hours.  Addended by:  Arnell Sieving, M.D.  **END ADDENDUM** SIGNED BY: Arnell Sieving, M.D.   06/11/2012    *RADIOLOGY REPORT*  Clinical Data:  Suspicious calcifications in the upper outer quadrant of the right breast.  STEREOTACTIC-GUIDED VACUUM ASSISTED BIOPSY OF THE RIGHT BREAST AND SPECIMEN RADIOGRAPH  Comparison: Diagnostic mammograms earlier same date and dating back to December, 2009.  I met with the patient and we discussed the procedure of stereotactic-guided biopsy, including benefits and alternatives. We discussed the high likelihood of a successful procedure. We discussed the risks of the procedure, including infection, bleeding, tissue injury, clip migration, and inadequate sampling. Informed, written  consent was given.  Using sterile technique, 2% lidocaine, stereotactic guidance, and a 9 gauge vacuum assisted device, biopsy was performed of the calcifications in the upper outer quadrant of the right breast. Specimen radiograph was performed, showing calcifications in several of the cores.  At the conclusion of the procedure, an S-mark tissue marker clip was deployed into the biopsy cavity.  Follow-up 2-view mammogram confirmed clip placement in the appropriate position in the upper outer quadrant of the right breast in the area of calcifications.  IMPRESSION: Stereotactic-guided biopsy of calcifications in the upper outer quadrant of the right breast.  No apparent complications.  This was discussed with Dr. Marcelle Overlie by telephone on 06/11/2012 at 1530 hours.  Original Report Authenticated By: Arnell Sieving, M.D.   Mm Digital Diagnostic Bilat  06/11/2012   *RADIOLOGY REPORT*  Clinical Data:  Short-term interval follow-up of right breast calcifications.  Annual reevaluation, left breast.  DIGITAL DIAGNOSTIC BILATERAL MAMMOGRAM WITH CAD  Comparison:  12/01/2011, 05/26/2011, 05/09/2011, dating back to 11/09/2008.  Findings:  CC and MLO views of both breasts and spot magnification views of the right breast calcifications were obtained. Scattered fibroglandular breast tissue, unchanged. There has been increase in the number of the calcifications in the upper outer quadrant right breast since the examination 6 months ago.  The calcifications are pleomorphic, with punctate and linear forms.  The maximum in size of the cluster approximates 16 mm.  No new masses, architectural distortion, or suspicious calcifications elsewhere in either breast. Mammographic images were processed with CAD.  IMPRESSION:  1.  Interval increase in the number of pleomorphic calcifications in the upper outer quadrant of the right breast since the examination 6 months ago. 2.  No mammographic evidence of malignancy, left breast.   RECOMMENDATION: Stereotactic core needle biopsy of the calcifications was discussed with the patient, in lieu of the increasing number and increasing pleomorphism.  She has agreed to proceed, this was performed subsequently and will be reported separately.  BI-RADS CATEGORY 4:  Suspicious abnormality - biopsy should be considered.  Original Report Authenticated By: Arnell Sieving, M.D.     LABS:    Chemistry  Component Value Date/Time   NA 131* 06/19/2012 0807   K 3.7 06/19/2012 0807   CL 92* 06/19/2012 0807   CO2 28 06/19/2012 0807   BUN 16 06/19/2012 0807   CREATININE 0.68 06/19/2012 0807      Component Value Date/Time   CALCIUM 10.4 06/19/2012 0807   ALKPHOS 103 06/19/2012 0807   AST 41* 06/19/2012 0807   ALT 24 06/19/2012 0807   BILITOT 0.4 06/19/2012 0807      Lab Results  Component Value Date   WBC 9.9 06/19/2012   HGB 13.7 06/19/2012   HCT 40.4 06/19/2012   MCV 89.7 06/19/2012   PLT 300 06/19/2012       PATHOLOGY ER positive DCIS ASSESSMENT    healthy woman with DCIS current plan is for to undergo surgery of family she'll be seen afterwards to discuss definitive therapy.  Clinical Trial Eligibility: B. 43 Multidisciplinary conference discussion yes    PLAN:    patient has ER positive DCIS and is an excellent candidate for adjuvant hormonal therapy with tamoxifen. I discussed the 8 with her and she'll be seen after her surgery by one of the research nurses. I did discuss tamoxifen therapy with her and and some of the side effects. He is having a few hot flashes appear       Discussion: Patient is being treated per NCCN breast cancer care guidelines appropriate for stage.0   Thank you so much for allowing me to participate in the care of [name]. I will continue to follow up the patient with you and assist in her care.  All questions were answered. The patient knows to call the clinic with any problems, questions or concerns. We can certainly see the patient much  sooner if necessary.  I spent 40 minutes counseling the patient face to face. The total time spent in the appointment was 20 minutes.      Pierce Crane M.D. FRCP C.  06/19/2012, 12:26 PM

## 2012-06-19 NOTE — Progress Notes (Signed)
Patient ID: [name], female   DOB: 02/25/1961, 51 y.o.   MRN: [id number]  Chief Complaint  Patient presents with  . Breast Cancer    Right    HPI [name] is a 51 y.o. female.  She recently had a mammogram or an area of calcifications was noted in the upper-outer quadrant of the right breast. A needle core biopsy has shown high-grade ductal carcinoma in situ, receptor positive. MRI has shown no other lesions. She comes the breast multidisciplinary clinic today for evaluation and recommendations She's not had other breast problems and no family history of significance. HPI  Past Medical History  Diagnosis Date  . Hypertension   . Palpitations   . Ectopic pregnancy     x1  . Obesity   . Hyperlipidemia   . Fatigue   . Diarrhea   . Insomnia   . Lower extremity edema     Resolved  . Hives     Etiology questionable  . Fatty liver     History of Ftty Liver  . Breast cancer   . Diabetes mellitus     Past Surgical History  Procedure Date  . Tubal ligation     At age 28  . Abdominal hysterectomy     Family History  Problem Relation Age of Onset  . Heart failure Mother   . Heart attack Mother   . Heart attack Father     Social History History  Substance Use Topics  . Smoking status: Former Smoker    Quit date: 01/22/1983  . Smokeless tobacco: Not on file  . Alcohol Use: No    Allergies  Allergen Reactions  . Erythromycin   . Zithromax (Azithromycin)     Current Outpatient Prescriptions  Medication Sig Dispense Refill  . amLODipine (NORVASC) 5 MG tablet Take 5 mg by mouth daily.        . aspirin 81 MG tablet Take 81 mg by mouth daily.        . Cetirizine HCl (ZYRTEC PO) Take by mouth daily.        . Cholecalciferol (VITAMIN D PO) Take by mouth daily.        . colesevelam (WELCHOL) 625 MG tablet Take 1,875 mg by mouth 2 (two) times daily with a meal.      . cycloSPORINE (RESTASIS) 0.05 % ophthalmic emulsion Place 1 drop into both eyes 2 (two) times  daily.      . fluticasone (FLONASE) 50 MCG/ACT nasal spray Place 2 sprays into the nose daily.      . hydrochlorothiazide (HYDRODIURIL) 25 MG tablet TAKE 1 TABLET BY MOUTH EVERY DAY  90 tablet  0  . lactobacillus acidophilus (BACID) TABS Take 2 tablets by mouth 3 (three) times daily.      . lisinopril-hydrochlorothiazide (PRINZIDE,ZESTORETIC) 10-12.5 MG per tablet Take 1 tablet by mouth daily.      . magnesium oxide (MAG-OX) 400 MG tablet Take 400 mg by mouth daily.      . metoprolol (TOPROL-XL) 50 MG 24 hr tablet TAKE 1 TABLET BY MOUTH TWICE DAILY  60 tablet  0  . Multiple Vitamin (MULTIVITAMIN) tablet Take 1 tablet by mouth daily.        . omega-3 acid ethyl esters (LOVAZA) 1 G capsule Take 2 g by mouth 2 (two) times daily.        . omeprazole (PRILOSEC) 40 MG capsule Take 40 mg by mouth daily.      . pantoprazole (PROTONIX)   40 MG tablet Take 1 tablet (40 mg total) by mouth daily.  30 tablet  0  . rosuvastatin (CRESTOR) 10 MG tablet Take 10 mg by mouth daily.        . vitamin C (ASCORBIC ACID) 500 MG tablet Take 500 mg by mouth daily.        Review of Systems Review of Systems  Constitutional: Negative for fever, chills and unexpected weight change.  HENT: Negative for hearing loss, congestion, sore throat, trouble swallowing and voice change.   Eyes: Negative for visual disturbance.  Respiratory: Negative for cough and wheezing.   Cardiovascular: Negative for chest pain, palpitations and leg swelling.  Gastrointestinal: Negative for nausea, vomiting, abdominal pain, diarrhea, constipation, blood in stool, abdominal distention and anal bleeding.  Genitourinary: Negative for hematuria, vaginal bleeding and difficulty urinating.  Musculoskeletal: Negative for arthralgias.  Skin: Negative for rash and wound.  Neurological: Negative for seizures, syncope and headaches.  Hematological: Negative for adenopathy. Does not bruise/bleed easily.  Psychiatric/Behavioral: Negative for confusion.     Blood pressure 131/71, pulse 66, temperature 97.8 F (36.6 C), resp. rate 20, height 5' 6.2" (1.681 m), weight 200 lb (90.719 kg).  Physical Exam Physical Exam  Vitals reviewed. Constitutional: She is oriented to person, place, and time. She appears well-developed and well-nourished. No distress.  HENT:  Head: Normocephalic and atraumatic.  Mouth/Throat: Oropharynx is clear and moist.  Eyes: Conjunctivae and EOM are normal. Pupils are equal, round, and reactive to light. No scleral icterus.  Neck: Normal range of motion. Neck supple. No tracheal deviation present. No thyromegaly present.  Cardiovascular: Normal rate, regular rhythm, normal heart sounds and intact distal pulses.  Exam reveals no gallop and no friction rub.   No murmur heard. Pulmonary/Chest: Effort normal and breath sounds normal. No respiratory distress. She has no wheezes. She has no rales.       No palpable breast mass and no visible abnormality on either side.  Abdominal: Soft. Bowel sounds are normal. She exhibits no distension and no mass. There is no tenderness. There is no rebound and no guarding.  Musculoskeletal: Normal range of motion. She exhibits no edema and no tenderness.  Lymphadenopathy:    She has no cervical adenopathy.    She has no axillary adenopathy.  Neurological: She is alert and oriented to person, place, and time.  Skin: Skin is warm and dry. No rash noted. She is not diaphoretic. No erythema.  Psychiatric: She has a normal mood and affect. Her behavior is normal. Judgment and thought content normal.    Data Reviewed I have reviewed the mammogram and MRI films and reports with the radiologist and reviewed the pathology slides and reports with the pathologist.  Assessment    Clinical stage 0 right breast cancer upper-outer quadrant, DCIS    Plan    I have explained the pathophysiology and staging of breast cancer with particular attention to her exact situation. We discussed the  multidisciplinary approach to breast cancer which often includes both medical and radiation oncology consultations.  We also discussed surgical options for the treatment of breast cancer including lumpectomy and mastectomy with possible reconstructive surgery. In addition we talked about the evaluation and management of lymph nodes including a description of sentinel lymph node biopsy and axillary dissections. We reviewed potential complications and risks including bleeding, infection, numbness,  lymphedema, and the potential need for additional surgery.  She understands that for patients who are candidate for lumpectomy or mastectomy there is an equal   survival rate with either technique, but a slightly higher local recurrence rate with lumpectomy. In addition she knows that a lumpectomy usually requires postoperative radiation as part of the management of the breast cancer.  We have discussed the likely postoperative course and plans for followup.  I have given the patient some written information that reviewed all of these issues. I believe her questions are answered and that she has a good understanding of the issues.  After discussion and review the patient would like to proceed to scheduling a wire localized surgical excision of her breast cancer. At this point I do not believe she needs some lymph node evaluation. I think all of her questions and all of her Husband's questions questions have been answered.       Tjuana Vickrey J 06/19/2012, 10:55 AM    

## 2012-06-19 NOTE — Progress Notes (Signed)
[facility] Psychosocial Distress Screening Clinical Social Work  Pt completed distress screening protocol, and scored a 9 on the Psychosocial Distress Thermometer which indicates severe distress. Clinical Child psychotherapist met with pt and pt's husband in [facility] to assess for distress and other psychosocial needs.  Pt stated she was feeling "much better" after meeting with the physicians and having some questions answered.  Pt did report some anxiety associated with surgery and radiation.  CSW validated the pt's feelings and informed her of support available at [facility].  Pt stated she has a large support system in her family and work community.  CSW provided pt information on the support team and support programs at [facility] and encouraged her to call with any questions or concerns.        Clinical Social Worker follow up needed: not at this time  Tamala Julian, MSW, LCSW Clinical Social Worker [facility] [phone]

## 2012-06-19 NOTE — Patient Instructions (Signed)
My office should call you to schedule surgery to remove the small breast cancer in your right breast. If you have any questions or have not heard from the office within 2 days please call us at [phone]

## 2012-06-19 NOTE — Progress Notes (Signed)
[facility] Radiation Oncology NEW PATIENT EVALUATION  Name: [name] MRN: [id number]  Date:   06/19/2012           DOB: 01-Nov-1961  Status: outpatient   CC:   Currie Paris, MD , Dr. 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 female who is seen today at the [facility] for evaluation of her DCIS of the right breast. On 06/11/2012 she had short interval followup for evaluation of right breast calcifications. There appear to be an increase in number within the upper-outer quadrant of the breast. Stereotactic biopsy on 06/11/2012 was diagnostic for high-grade DCIS with calcifications in necrosis. ER was positive at 35% and PR weakly positive at 5%. Breast MR on 06/18/2012 showed clump nodular enhancement within the upper-outer quadrant along with clip artifact consistent with biopsy-proven DCIS. She seen today with Dr. Jamey Ripa and Dr. Donnie Coffin.  PREVIOUS RADIATION THERAPY: No   PAST MEDICAL HISTORY:  has a past medical history of Hypertension; Palpitations; Ectopic pregnancy; Obesity; Hyperlipidemia; Fatigue; Diarrhea; Insomnia; Lower extremity edema; Hives; Fatty liver; Breast cancer; and Diabetes mellitus.     PAST SURGICAL HISTORY:  Past Surgical History  Procedure Date  . Tubal ligation     At age 24  . Abdominal hysterectomy      FAMILY HISTORY: family history includes Heart attack in her father and mother and Heart failure in her mother. Her mother died from lung cancer at age 60, and her father died following a heart attack at 79. A brother died from complications of throat cancer at age 76, and also had a history of Hodgkin's disease. No family history of breast cancer.   SOCIAL HISTORY:  reports that she quit smoking about 29 years ago. She does not have any smokeless tobacco history on file. She reports that  she does not drink alcohol or use illicit drugs. Married, 2 children. She works as an IT sales professional.   ALLERGIES: Erythromycin and Zithromax   MEDICATIONS:  Current Outpatient Prescriptions  Medication Sig Dispense Refill  . amLODipine (NORVASC) 5 MG tablet Take 5 mg by mouth daily.        Marland Kitchen aspirin 81 MG tablet Take 81 mg by mouth daily.        . Cetirizine HCl (ZYRTEC PO) Take by mouth daily.        . Cholecalciferol (VITAMIN D PO) Take by mouth daily.        . colesevelam (WELCHOL) 625 MG tablet Take 1,875 mg by mouth 2 (two) times daily with a meal.      . cycloSPORINE (RESTASIS) 0.05 % ophthalmic emulsion Place 1 drop into both eyes 2 (two) times daily.      . fluticasone (FLONASE) 50 MCG/ACT nasal spray Place 2 sprays into the nose daily.      . hydrochlorothiazide (HYDRODIURIL) 25 MG tablet TAKE 1 TABLET BY MOUTH EVERY DAY  90 tablet  0  . lactobacillus acidophilus (BACID) TABS Take 2 tablets by mouth 3 (three) times daily.      Marland Kitchen lisinopril-hydrochlorothiazide (PRINZIDE,ZESTORETIC) 10-12.5 MG per tablet Take 1 tablet by mouth daily.      . magnesium oxide (MAG-OX) 400 MG tablet Take 400 mg by mouth daily.      . metoprolol (TOPROL-XL) 50 MG 24 hr tablet TAKE 1  TABLET BY MOUTH TWICE DAILY  60 tablet  0  . Multiple Vitamin (MULTIVITAMIN) tablet Take 1 tablet by mouth daily.        Marland Kitchen omega-3 acid ethyl esters (LOVAZA) 1 G capsule Take 2 g by mouth 2 (two) times daily.        Marland Kitchen omeprazole (PRILOSEC) 40 MG capsule Take 40 mg by mouth daily.      . pantoprazole (PROTONIX) 40 MG tablet Take 1 tablet (40 mg total) by mouth daily.  30 tablet  0  . rosuvastatin (CRESTOR) 10 MG tablet Take 10 mg by mouth daily.        . vitamin C (ASCORBIC ACID) 500 MG tablet Take 500 mg by mouth daily.         REVIEW OF SYSTEMS:  Pertinent items are noted in HPI.    PHYSICAL EXAM: Alert and oriented 51 year old white female appearing her stated age. Wt Readings from Last 3 Encounters:    06/19/12 200 lb (90.719 kg)  06/19/12 199 lb 9.6 oz (90.538 kg)  06/19/12 200 lb (90.719 kg)   Temp Readings from Last 3 Encounters:  06/19/12 97.8 F (36.6 C)   06/19/12 97.8 F (36.6 C)    BP Readings from Last 3 Encounters:  06/19/12 131/71  06/19/12 131/71   Pulse Readings from Last 3 Encounters:  06/19/12 66  06/19/12 66   Head and neck examination: Grossly unremarkable. Nodes: Without palpable cervical, supraclavicular, or axillary lymphadenopathy. Chest: Lungs clear. Heart: Regular rate and rhythm. Back: Without spinal or CVA tenderness. Breasts: Punctate biopsy wound at approximately 11:00 along the upper outer quadrant of the right breast. No discreet masses are appreciated. Left breast without masses or lesions. Extremities without edema. Neurologic examination grossly nonfocal.   LABORATORY DATA:  Lab Results  Component Value Date   WBC 9.9 06/19/2012   HGB 13.7 06/19/2012   HCT 40.4 06/19/2012   MCV 89.7 06/19/2012   PLT 300 06/19/2012   Lab Results  Component Value Date   NA 131* 06/19/2012   K 3.7 06/19/2012   CL 92* 06/19/2012   CO2 28 06/19/2012   Lab Results  Component Value Date   ALT 24 06/19/2012   AST 41* 06/19/2012   ALKPHOS 103 06/19/2012   BILITOT 0.4 06/19/2012      IMPRESSION: Stage 0 (Tis N0 M0) DCIS of the right breast. I explained to the patient and her family that her local treatment options include mastectomy versus partial mastectomy followed by radiation therapy. We discussed the potential acute and late toxicities of radiation therapy. She may be a candidate for the B. 43 Herceptin trial. This can be discussed by Dr. Donnie Coffin.   PLAN: At this point in time she is interested in breast preservation. I would like to see her for a followup visit to 3 weeks following her scheduled surgery with Dr. Jamey Ripa. Dr. Donnie Coffin will see her in consultation for possible adjuvant hormone therapy and or discussion of the B. 43 Herceptin trial.   I spent 40 minutes minutes face to  face with the patient and more than 50% of that time was spent in counseling and/or coordination of care.

## 2012-06-20 ENCOUNTER — Encounter: Payer: Self-pay | Admitting: *Deleted

## 2012-06-20 ENCOUNTER — Telehealth: Payer: Self-pay | Admitting: Oncology

## 2012-06-20 NOTE — Telephone Encounter (Signed)
lmonvm adviisng the pt of her aug appts

## 2012-06-21 ENCOUNTER — Ambulatory Visit (HOSPITAL_BASED_OUTPATIENT_CLINIC_OR_DEPARTMENT_OTHER)
Admission: RE | Admit: 2012-06-21 | Discharge: 2012-06-21 | Disposition: A | Discharge status: Home / Self Care | Payer: BC Managed Care – PPO | Source: Ambulatory Visit | Attending: Surgery | Admitting: Surgery

## 2012-06-21 ENCOUNTER — Encounter (HOSPITAL_BASED_OUTPATIENT_CLINIC_OR_DEPARTMENT_OTHER)
Admission: RE | Disposition: A | Discharge status: Home / Self Care | Payer: Self-pay | Source: Ambulatory Visit | Attending: Surgery

## 2012-06-21 ENCOUNTER — Ambulatory Visit
Admission: RE | Admit: 2012-06-21 | Discharge: 2012-06-21 | Disposition: A | Discharge status: Home / Self Care | Payer: BC Managed Care – PPO | Source: Ambulatory Visit | Attending: Surgery | Admitting: Surgery

## 2012-06-21 ENCOUNTER — Encounter (HOSPITAL_BASED_OUTPATIENT_CLINIC_OR_DEPARTMENT_OTHER): Payer: Self-pay | Admitting: Certified Registered"

## 2012-06-21 ENCOUNTER — Encounter (HOSPITAL_BASED_OUTPATIENT_CLINIC_OR_DEPARTMENT_OTHER): Payer: Self-pay | Admitting: Anesthesiology

## 2012-06-21 ENCOUNTER — Encounter (HOSPITAL_BASED_OUTPATIENT_CLINIC_OR_DEPARTMENT_OTHER): Payer: Self-pay | Admitting: *Deleted

## 2012-06-21 ENCOUNTER — Ambulatory Visit (HOSPITAL_BASED_OUTPATIENT_CLINIC_OR_DEPARTMENT_OTHER): Payer: BC Managed Care – PPO | Admitting: Certified Registered"

## 2012-06-21 ENCOUNTER — Other Ambulatory Visit: Payer: Self-pay

## 2012-06-21 DIAGNOSIS — E119 Type 2 diabetes mellitus without complications: Secondary | ICD-10-CM | POA: Insufficient documentation

## 2012-06-21 DIAGNOSIS — D0590 Unspecified type of carcinoma in situ of unspecified breast: Secondary | ICD-10-CM

## 2012-06-21 DIAGNOSIS — C50419 Malignant neoplasm of upper-outer quadrant of unspecified female breast: Secondary | ICD-10-CM

## 2012-06-21 DIAGNOSIS — E785 Hyperlipidemia, unspecified: Secondary | ICD-10-CM | POA: Insufficient documentation

## 2012-06-21 DIAGNOSIS — E669 Obesity, unspecified: Secondary | ICD-10-CM | POA: Insufficient documentation

## 2012-06-21 DIAGNOSIS — I10 Essential (primary) hypertension: Secondary | ICD-10-CM | POA: Insufficient documentation

## 2012-06-21 HISTORY — DX: Encounter for fitting and adjustment of spectacles and contact lenses: Z46.0

## 2012-06-21 HISTORY — DX: Snoring: R06.83

## 2012-06-21 HISTORY — PX: BREAST SURGERY: SHX581

## 2012-06-21 SURGERY — BREAST LUMPECTOMY WITH NEEDLE LOCALIZATION
Anesthesia: General | Site: Breast | Laterality: Right | Wound class: Clean

## 2012-06-21 MED ORDER — METOCLOPRAMIDE HCL 5 MG/ML IJ SOLN: 10.0000 mg | Freq: Once | INTRAMUSCULAR | Status: DC | PRN

## 2012-06-21 MED ORDER — EPHEDRINE SULFATE 50 MG/ML IJ SOLN
INTRAMUSCULAR | Status: DC | PRN
  Administered 2012-06-21: 5 mg via INTRAVENOUS

## 2012-06-21 MED ORDER — OXYCODONE HCL 5 MG/5ML PO SOLN: 5.0000 mg | Freq: Once | ORAL | Status: AC | PRN

## 2012-06-21 MED ORDER — SCOPOLAMINE 1 MG/3DAYS TD PT72
1.0000 | MEDICATED_PATCH | Freq: Once | TRANSDERMAL | Status: DC
  Administered 2012-06-21: 1.5 mg via TRANSDERMAL

## 2012-06-21 MED ORDER — OXYCODONE-ACETAMINOPHEN 5-325 MG PO TABS: 1.0000 | ORAL_TABLET | ORAL | Status: DC | PRN

## 2012-06-21 MED ORDER — FENTANYL CITRATE 0.05 MG/ML IJ SOLN
INTRAMUSCULAR | Status: DC | PRN
  Administered 2012-06-21 (×2): 50 ug via INTRAVENOUS

## 2012-06-21 MED ORDER — HYDROMORPHONE HCL PF 1 MG/ML IJ SOLN
0.2500 mg | INTRAMUSCULAR | Status: DC | PRN
  Administered 2012-06-21 (×3): 0.5 mg via INTRAVENOUS

## 2012-06-21 MED ORDER — PROPOFOL 10 MG/ML IV EMUL
INTRAVENOUS | Status: DC | PRN
  Administered 2012-06-21: 250 mg via INTRAVENOUS

## 2012-06-21 MED ORDER — BUPIVACAINE HCL (PF) 0.25 % IJ SOLN
INTRAMUSCULAR | Status: DC | PRN
  Administered 2012-06-21: 30 mL

## 2012-06-21 MED ORDER — LIDOCAINE HCL (CARDIAC) 20 MG/ML IV SOLN
INTRAVENOUS | Status: DC | PRN
  Administered 2012-06-21: 100 mg via INTRAVENOUS

## 2012-06-21 MED ORDER — OXYCODONE HCL 5 MG PO TABS
5.0000 mg | ORAL_TABLET | Freq: Once | ORAL | Status: AC | PRN
  Administered 2012-06-21: 5 mg via ORAL

## 2012-06-21 MED ORDER — ACETAMINOPHEN 10 MG/ML IV SOLN
1000.0000 mg | Freq: Once | INTRAVENOUS | Status: AC
  Administered 2012-06-21: 1000 mg via INTRAVENOUS

## 2012-06-21 MED ORDER — LACTATED RINGERS IV SOLN
INTRAVENOUS | Status: DC
  Administered 2012-06-21 (×2): via INTRAVENOUS

## 2012-06-21 MED ORDER — CHLORHEXIDINE GLUCONATE 4 % EX LIQD: 1.0000 "application " | Freq: Once | CUTANEOUS | Status: DC

## 2012-06-21 MED ORDER — MIDAZOLAM HCL 5 MG/5ML IJ SOLN
INTRAMUSCULAR | Status: DC | PRN
  Administered 2012-06-21: 2 mg via INTRAVENOUS

## 2012-06-21 MED ORDER — DEXAMETHASONE SODIUM PHOSPHATE 4 MG/ML IJ SOLN
INTRAMUSCULAR | Status: DC | PRN
  Administered 2012-06-21: 10 mg via INTRAVENOUS

## 2012-06-21 MED ORDER — ONDANSETRON HCL 4 MG/2ML IJ SOLN
INTRAMUSCULAR | Status: DC | PRN
  Administered 2012-06-21: 4 mg via INTRAVENOUS

## 2012-06-21 MED ORDER — CEFAZOLIN SODIUM-DEXTROSE 2-3 GM-% IV SOLR
2.0000 g | INTRAVENOUS | Status: AC
  Administered 2012-06-21: 2 g via INTRAVENOUS

## 2012-06-21 SURGICAL SUPPLY — 50 items
ADH SKN CLS APL DERMABOND .7 (GAUZE/BANDAGES/DRESSINGS) ×1
APPLICATOR COTTON TIP 6IN STRL (MISCELLANEOUS) IMPLANT
BINDER BREAST LRG (GAUZE/BANDAGES/DRESSINGS) IMPLANT
BINDER BREAST MEDIUM (GAUZE/BANDAGES/DRESSINGS) IMPLANT
BINDER BREAST XLRG (GAUZE/BANDAGES/DRESSINGS) IMPLANT
BINDER BREAST XXLRG (GAUZE/BANDAGES/DRESSINGS) IMPLANT
BLADE HEX COATED 2.75 (ELECTRODE) ×2 IMPLANT
BLADE SURG 15 STRL LF DISP TIS (BLADE) ×1 IMPLANT
BLADE SURG 15 STRL SS (BLADE) ×2
CANISTER SUCTION 1200CC (MISCELLANEOUS) ×2 IMPLANT
CHLORAPREP W/TINT 26ML (MISCELLANEOUS) ×2 IMPLANT
CLIP TI MEDIUM 6 (CLIP) IMPLANT
CLIP TI WIDE RED SMALL 6 (CLIP) ×4 IMPLANT
CLOTH BEACON ORANGE TIMEOUT ST (SAFETY) ×2 IMPLANT
COVER MAYO STAND STRL (DRAPES) ×2 IMPLANT
COVER TABLE BACK 60X90 (DRAPES) ×2 IMPLANT
DECANTER SPIKE VIAL GLASS SM (MISCELLANEOUS) IMPLANT
DERMABOND ADVANCED (GAUZE/BANDAGES/DRESSINGS) ×1
DERMABOND ADVANCED .7 DNX12 (GAUZE/BANDAGES/DRESSINGS) ×1 IMPLANT
DEVICE DUBIN W/COMP PLATE 8390 (MISCELLANEOUS) ×1 IMPLANT
DRAPE LAPAROTOMY TRNSV 102X78 (DRAPE) ×2 IMPLANT
DRAPE UTILITY XL STRL (DRAPES) ×2 IMPLANT
ELECT REM PT RETURN 9FT ADLT (ELECTROSURGICAL) ×2
ELECTRODE REM PT RTRN 9FT ADLT (ELECTROSURGICAL) ×1 IMPLANT
GLOVE BIO SURGEON STRL SZ 6.5 (GLOVE) ×1 IMPLANT
GLOVE BIO SURGEON STRL SZ7 (GLOVE) ×1 IMPLANT
GLOVE BIOGEL PI IND STRL 7.5 (GLOVE) IMPLANT
GLOVE BIOGEL PI INDICATOR 7.5 (GLOVE) ×1
GLOVE ECLIPSE 6.5 STRL STRAW (GLOVE) ×1 IMPLANT
GLOVE EUDERMIC 7 POWDERFREE (GLOVE) ×2 IMPLANT
GOWN PREVENTION PLUS XLARGE (GOWN DISPOSABLE) ×4 IMPLANT
KIT MARKER MARGIN INK (KITS) ×1 IMPLANT
NDL HYPO 25X1 1.5 SAFETY (NEEDLE) ×1 IMPLANT
NEEDLE HYPO 25X1 1.5 SAFETY (NEEDLE) ×2 IMPLANT
NS IRRIG 1000ML POUR BTL (IV SOLUTION) ×1 IMPLANT
PACK BASIN DAY SURGERY FS (CUSTOM PROCEDURE TRAY) ×2 IMPLANT
PENCIL BUTTON HOLSTER BLD 10FT (ELECTRODE) ×2 IMPLANT
SLEEVE SCD COMPRESS KNEE MED (MISCELLANEOUS) ×2 IMPLANT
SPONGE GAUZE 4X4 12PLY (GAUZE/BANDAGES/DRESSINGS) IMPLANT
SPONGE INTESTINAL PEANUT (DISPOSABLE) ×1 IMPLANT
SPONGE LAP 4X18 X RAY DECT (DISPOSABLE) ×3 IMPLANT
STAPLER VISISTAT 35W (STAPLE) IMPLANT
SUT MNCRL AB 4-0 PS2 18 (SUTURE) ×2 IMPLANT
SUT SILK 0 TIES 10X30 (SUTURE) IMPLANT
SUT VICRYL 3-0 CR8 SH (SUTURE) ×2 IMPLANT
SYR CONTROL 10ML LL (SYRINGE) ×2 IMPLANT
TOWEL OR NON WOVEN STRL DISP B (DISPOSABLE) ×2 IMPLANT
TUBE CONNECTING 20X1/4 (TUBING) ×2 IMPLANT
WATER STERILE IRR 1000ML POUR (IV SOLUTION) ×1 IMPLANT
YANKAUER SUCT BULB TIP NO VENT (SUCTIONS) ×2 IMPLANT

## 2012-06-21 NOTE — Op Note (Signed)
[name]  03-15-1961  [id number]  06/21/2012   Preoperative diagnosis: Right breast cancer upper outer quadrant, DCIS, clinical stage 0  Postoperative diagnosis: The same  Procedure: Wire localized excision right breast cancer  Surgeon: Currie Paris, MD, FACS  Assistant: Charissa Bash, MS III  Anesthesia: General  Clinical History and Indications: this patient presents for a guidewire localized excision of a Right breast Cancer located in the upper outer quadrant.  Description of procedure: The patient was seen in the holding area and the plans for the procedure reviewed. The Right breast was marked as the operative side. The wire localizing films were reviewed.The guidewire entered superiorly and tracked inferiorly. It is about 6 cm from the skin and 2.2 the clip marking the location of the cancer.  The patient was taken to the operating room and after satisfactory general anesthesia had been obtained the Right breast was prepped and draped and the timeout was performed.  The incision was made over the presumed area of the mass. This was a curvilinear incision about 2 cm below the skin and 2 point the guidewire. When the patient was supine with her arm outstretched the guidewire appeared to travel somewhat laterally as well as inferiorly.Skin flaps were raised and using cautery the area was completely excised. Bleeders were controlled with either cautery or sutures as needed.I went down to the chest wall exposing it and taking the fascia from the muscle. Grossly I appeared to be well around the guidewire  The breast tissue at the margins all appeared to be fatty with the exception of inferior where there appeared to be some breast tissue.  I then injected about 30 cc of 0.25% Marcaine to help with postop pain relief. I irrigated to make sure everything was dry. I put clips and to mark the margins of the lumpectomy cavity.  After achieving hemostasis, the incision was closed  with 3-0 Vicryl, 4-0 Monocryl subcuticular, and Dermabond. The patient tolerated the procedure well. There were no operative complications. All counts were correct.   EBL: Minimal  Currie Paris, MD, FACS 06/21/2012 3:59 PM

## 2012-06-21 NOTE — Interval H&P Note (Signed)
History and Physical Interval Note:  06/21/2012 2:50 PM  [name]  has presented today for surgery, with the diagnosis of right breast cancer  The various methods of treatment have been discussed with the patient and family. After consideration of risks, benefits and other options for treatment, the patient has consented to  Procedure(s) (LRB): BREAST LUMPECTOMY WITH NEEDLE LOCALIZATION (Right) as a surgical intervention .  The patient's history has been reviewed, patient examined, no change in status, stable for surgery.  I have reviewed the patient's chart and labs.  Questions were answered to the patient's satisfaction.    Right breast marked as the operative side [name]

## 2012-06-21 NOTE — H&P (View-Only) (Signed)
Patient ID: [name], female   DOB: 24-Aug-1961, 51 y.o.   MRN: [id number]  Chief Complaint  Patient presents with  . Breast Cancer    Right    HPI [name] is a 51 y.o. female.  She recently had a mammogram or an area of calcifications was noted in the upper-outer quadrant of the right breast. A needle core biopsy has shown high-grade ductal carcinoma in situ, receptor positive. MRI has shown no other lesions. She comes the breast multidisciplinary clinic today for evaluation and recommendations She's not had other breast problems and no family history of significance. HPI  Past Medical History  Diagnosis Date  . Hypertension   . Palpitations   . Ectopic pregnancy     x1  . Obesity   . Hyperlipidemia   . Fatigue   . Diarrhea   . Insomnia   . Lower extremity edema     Resolved  . Hives     Etiology questionable  . Fatty liver     History of Ftty Liver  . Breast cancer   . Diabetes mellitus     Past Surgical History  Procedure Date  . Tubal ligation     At age 86  . Abdominal hysterectomy     Family History  Problem Relation Age of Onset  . Heart failure Mother   . Heart attack Mother   . Heart attack Father     Social History History  Substance Use Topics  . Smoking status: Former Smoker    Quit date: 01/22/1983  . Smokeless tobacco: Not on file  . Alcohol Use: No    Allergies  Allergen Reactions  . Erythromycin   . Zithromax (Azithromycin)     Current Outpatient Prescriptions  Medication Sig Dispense Refill  . amLODipine (NORVASC) 5 MG tablet Take 5 mg by mouth daily.        Marland Kitchen aspirin 81 MG tablet Take 81 mg by mouth daily.        . Cetirizine HCl (ZYRTEC PO) Take by mouth daily.        . Cholecalciferol (VITAMIN D PO) Take by mouth daily.        . colesevelam (WELCHOL) 625 MG tablet Take 1,875 mg by mouth 2 (two) times daily with a meal.      . cycloSPORINE (RESTASIS) 0.05 % ophthalmic emulsion Place 1 drop into both eyes 2 (two) times  daily.      . fluticasone (FLONASE) 50 MCG/ACT nasal spray Place 2 sprays into the nose daily.      . hydrochlorothiazide (HYDRODIURIL) 25 MG tablet TAKE 1 TABLET BY MOUTH EVERY DAY  90 tablet  0  . lactobacillus acidophilus (BACID) TABS Take 2 tablets by mouth 3 (three) times daily.      Marland Kitchen lisinopril-hydrochlorothiazide (PRINZIDE,ZESTORETIC) 10-12.5 MG per tablet Take 1 tablet by mouth daily.      . magnesium oxide (MAG-OX) 400 MG tablet Take 400 mg by mouth daily.      . metoprolol (TOPROL-XL) 50 MG 24 hr tablet TAKE 1 TABLET BY MOUTH TWICE DAILY  60 tablet  0  . Multiple Vitamin (MULTIVITAMIN) tablet Take 1 tablet by mouth daily.        Marland Kitchen omega-3 acid ethyl esters (LOVAZA) 1 G capsule Take 2 g by mouth 2 (two) times daily.        Marland Kitchen omeprazole (PRILOSEC) 40 MG capsule Take 40 mg by mouth daily.      . pantoprazole (PROTONIX)  40 MG tablet Take 1 tablet (40 mg total) by mouth daily.  30 tablet  0  . rosuvastatin (CRESTOR) 10 MG tablet Take 10 mg by mouth daily.        . vitamin C (ASCORBIC ACID) 500 MG tablet Take 500 mg by mouth daily.        Review of Systems Review of Systems  Constitutional: Negative for fever, chills and unexpected weight change.  HENT: Negative for hearing loss, congestion, sore throat, trouble swallowing and voice change.   Eyes: Negative for visual disturbance.  Respiratory: Negative for cough and wheezing.   Cardiovascular: Negative for chest pain, palpitations and leg swelling.  Gastrointestinal: Negative for nausea, vomiting, abdominal pain, diarrhea, constipation, blood in stool, abdominal distention and anal bleeding.  Genitourinary: Negative for hematuria, vaginal bleeding and difficulty urinating.  Musculoskeletal: Negative for arthralgias.  Skin: Negative for rash and wound.  Neurological: Negative for seizures, syncope and headaches.  Hematological: Negative for adenopathy. Does not bruise/bleed easily.  Psychiatric/Behavioral: Negative for confusion.     Blood pressure 131/71, pulse 66, temperature 97.8 F (36.6 C), resp. rate 20, height 5' 6.2" (1.681 m), weight 200 lb (90.719 kg).  Physical Exam Physical Exam  Vitals reviewed. Constitutional: She is oriented to person, place, and time. She appears well-developed and well-nourished. No distress.  HENT:  Head: Normocephalic and atraumatic.  Mouth/Throat: Oropharynx is clear and moist.  Eyes: Conjunctivae and EOM are normal. Pupils are equal, round, and reactive to light. No scleral icterus.  Neck: Normal range of motion. Neck supple. No tracheal deviation present. No thyromegaly present.  Cardiovascular: Normal rate, regular rhythm, normal heart sounds and intact distal pulses.  Exam reveals no gallop and no friction rub.   No murmur heard. Pulmonary/Chest: Effort normal and breath sounds normal. No respiratory distress. She has no wheezes. She has no rales.       No palpable breast mass and no visible abnormality on either side.  Abdominal: Soft. Bowel sounds are normal. She exhibits no distension and no mass. There is no tenderness. There is no rebound and no guarding.  Musculoskeletal: Normal range of motion. She exhibits no edema and no tenderness.  Lymphadenopathy:    She has no cervical adenopathy.    She has no axillary adenopathy.  Neurological: She is alert and oriented to person, place, and time.  Skin: Skin is warm and dry. No rash noted. She is not diaphoretic. No erythema.  Psychiatric: She has a normal mood and affect. Her behavior is normal. Judgment and thought content normal.    Data Reviewed I have reviewed the mammogram and MRI films and reports with the radiologist and reviewed the pathology slides and reports with the pathologist.  Assessment    Clinical stage 0 right breast cancer upper-outer quadrant, DCIS    Plan    I have explained the pathophysiology and staging of breast cancer with particular attention to her exact situation. We discussed the  multidisciplinary approach to breast cancer which often includes both medical and radiation oncology consultations.  We also discussed surgical options for the treatment of breast cancer including lumpectomy and mastectomy with possible reconstructive surgery. In addition we talked about the evaluation and management of lymph nodes including a description of sentinel lymph node biopsy and axillary dissections. We reviewed potential complications and risks including bleeding, infection, numbness,  lymphedema, and the potential need for additional surgery.  She understands that for patients who are candidate for lumpectomy or mastectomy there is an equal  survival rate with either technique, but a slightly higher local recurrence rate with lumpectomy. In addition she knows that a lumpectomy usually requires postoperative radiation as part of the management of the breast cancer.  We have discussed the likely postoperative course and plans for followup.  I have given the patient some written information that reviewed all of these issues. I believe her questions are answered and that she has a good understanding of the issues.  After discussion and review the patient would like to proceed to scheduling a wire localized surgical excision of her breast cancer. At this point I do not believe she needs some lymph node evaluation. I think all of her questions and all of her Husband's questions questions have been answered.       Keynan Heffern J 06/19/2012, 10:55 AM

## 2012-06-21 NOTE — Anesthesia Postprocedure Evaluation (Signed)
Anesthesia Post Note  Patient: [name]  Procedure(s) Performed: Procedure(s) (LRB): BREAST LUMPECTOMY WITH NEEDLE LOCALIZATION (Right)  Anesthesia type: General  Patient location: PACU  Post pain: Pain level controlled  Post assessment: Patient's Cardiovascular Status Stable  Last Vitals:  Filed Vitals:   06/21/12 1645  BP: 126/87  Pulse: 62  Temp:   Resp: 16    Post vital signs: Reviewed and stable  Level of consciousness: alert  Complications: No apparent anesthesia complications

## 2012-06-21 NOTE — Anesthesia Preprocedure Evaluation (Signed)
Anesthesia Evaluation  Patient identified by MRN, date of birth, ID band Patient awake    Reviewed: Allergy & Precautions, H&P , NPO status , Patient's Chart, lab work & pertinent test results, reviewed documented beta blocker date and time   Airway Mallampati: II TM Distance: >3 FB Neck ROM: full    Dental   Pulmonary neg pulmonary ROS,  breath sounds clear to auscultation        Cardiovascular hypertension, Pt. on medications and Pt. on home beta blockers Rhythm:regular     Neuro/Psych negative neurological ROS  negative psych ROS   GI/Hepatic negative GI ROS, Neg liver ROS,   Endo/Other  Oral Hypoglycemic Agents  Renal/GU negative Renal ROS  negative genitourinary   Musculoskeletal   Abdominal   Peds  Hematology negative hematology ROS (+)   Anesthesia Other Findings See surgeon's H&P   Reproductive/Obstetrics negative OB ROS                           Anesthesia Physical Anesthesia Plan  ASA: II  Anesthesia Plan: General   Post-op Pain Management:    Induction: Intravenous  Airway Management Planned: LMA  Additional Equipment:   Intra-op Plan:   Post-operative Plan: Extubation in OR  Informed Consent: I have reviewed the patients History and Physical, chart, labs and discussed the procedure including the risks, benefits and alternatives for the proposed anesthesia with the patient or authorized representative who has indicated his/her understanding and acceptance.   Dental Advisory Given  Plan Discussed with: CRNA and Surgeon  Anesthesia Plan Comments:         Anesthesia Quick Evaluation

## 2012-06-21 NOTE — Transfer of Care (Signed)
Immediate Anesthesia Transfer of Care Note  Patient: [name]  Procedure(s) Performed: Procedure(s) (LRB): BREAST LUMPECTOMY WITH NEEDLE LOCALIZATION (Right)  Patient Location: PACU  Anesthesia Type: General  Level of Consciousness: sedated  Airway & Oxygen Therapy: Patient Spontanous Breathing and Patient connected to face mask oxygen  Post-op Assessment: Report given to PACU RN and Post -op Vital signs reviewed and stable  Post vital signs: Reviewed and stable  Complications: No apparent anesthesia complications

## 2012-06-24 ENCOUNTER — Telehealth: Payer: Self-pay | Admitting: *Deleted

## 2012-06-24 ENCOUNTER — Telehealth (INDEPENDENT_AMBULATORY_CARE_PROVIDER_SITE_OTHER): Payer: Self-pay | Admitting: General Surgery

## 2012-06-24 ENCOUNTER — Encounter (INDEPENDENT_AMBULATORY_CARE_PROVIDER_SITE_OTHER): Payer: Self-pay | Admitting: General Surgery

## 2012-06-24 NOTE — Telephone Encounter (Signed)
Spoke to pt concerning [facility] .  Pt denies questions or concerns regarding dx or treatment care plan.  Encourage pt to call with needs.  Confirmed future appts and surgery date.  Contact information given.

## 2012-06-24 NOTE — Telephone Encounter (Signed)
Left vm for pt to return call regarding [facility] from 06/19/12.

## 2012-06-24 NOTE — Telephone Encounter (Signed)
Appt made 07/01/12 @ 9:30 am.

## 2012-06-24 NOTE — Telephone Encounter (Signed)
Message copied by Liliana Cline on Mon Jun 24, 2012  1:15 PM ------      Message from: Marnette Burgess      Created: Mon Jun 24, 2012 12:49 PM       Patient needs a 2wk po, discharged on 06/21/12, please call to schedule.

## 2012-06-25 ENCOUNTER — Encounter: Payer: Self-pay | Admitting: *Deleted

## 2012-06-25 ENCOUNTER — Telehealth (INDEPENDENT_AMBULATORY_CARE_PROVIDER_SITE_OTHER): Payer: Self-pay | Admitting: General Surgery

## 2012-06-25 NOTE — Telephone Encounter (Signed)
Patient aware path results are good. Margins ok. She will follow up in the office at her scheduled appt and call with any questions prior.  

## 2012-06-25 NOTE — Progress Notes (Signed)
Mailed after appt letter to pt. 

## 2012-06-25 NOTE — Telephone Encounter (Signed)
Message copied by Liliana Cline on Tue Jun 25, 2012  8:04 AM ------      Message from: Currie Paris      Created: Tue Jun 25, 2012  7:02 AM       Lesly Rubenstein, her margins are negative and the tumor all non-invasive.

## 2012-07-01 ENCOUNTER — Encounter (INDEPENDENT_AMBULATORY_CARE_PROVIDER_SITE_OTHER): Payer: Self-pay | Admitting: Surgery

## 2012-07-01 ENCOUNTER — Ambulatory Visit (INDEPENDENT_AMBULATORY_CARE_PROVIDER_SITE_OTHER): Payer: BC Managed Care – PPO | Admitting: Surgery

## 2012-07-01 VITALS — BP 118/80 | HR 60 | Temp 96.8°F | Ht 66.0 in | Wt 196.0 lb

## 2012-07-01 DIAGNOSIS — I10 Essential (primary) hypertension: Secondary | ICD-10-CM | POA: Insufficient documentation

## 2012-07-01 DIAGNOSIS — C50419 Malignant neoplasm of upper-outer quadrant of unspecified female breast: Secondary | ICD-10-CM

## 2012-07-01 NOTE — Patient Instructions (Signed)
See me again after you finish radiation 

## 2012-07-01 NOTE — Progress Notes (Signed)
NAME: [name]                                            DOB: 11/10/1961 DATE: 07/01/2012                                                  MRN: [id number]  CC: Post op   HPI: This patient comes in for post op follow-up.Sheunderwent right NL lumpectomy  on 06/21/12. She feels that she is doing well.Mild  Discomfort, improviing  PE:  VITAL SIGNS: BP 118/80  Pulse 60  Temp 96.8 F (36 C) (Temporal)  Ht 5\' 6"  (1.676 m)  Wt 196 lb (88.905 kg)  BMI 31.64 kg/m2  SpO2 98%  General: The patient appears to be healthy, NAD Incision healing nicely, no infection or hematoma  DATA REVIEWED: Path, DCIS, 0.7 cm, neg margin, receptor +  IMPRESSION: The patient is doing well S/P Right lumpectomy.    PLAN: RTC 2 months, after radiation

## 2012-07-02 ENCOUNTER — Encounter: Payer: Self-pay | Admitting: Radiation Oncology

## 2012-07-02 ENCOUNTER — Ambulatory Visit
Admission: RE | Admit: 2012-07-02 | Discharge: 2012-07-02 | Disposition: A | Discharge status: Home / Self Care | Payer: BC Managed Care – PPO | Source: Ambulatory Visit | Attending: Radiation Oncology | Admitting: Radiation Oncology

## 2012-07-02 VITALS — BP 119/77 | HR 80 | Temp 98.3°F | Resp 20 | Wt 193.8 lb

## 2012-07-02 DIAGNOSIS — Y842 Radiological procedure and radiotherapy as the cause of abnormal reaction of the patient, or of later complication, without mention of misadventure at the time of the procedure: Secondary | ICD-10-CM | POA: Insufficient documentation

## 2012-07-02 DIAGNOSIS — C50419 Malignant neoplasm of upper-outer quadrant of unspecified female breast: Secondary | ICD-10-CM

## 2012-07-02 DIAGNOSIS — Z510 Encounter for antineoplastic radiation therapy: Secondary | ICD-10-CM | POA: Insufficient documentation

## 2012-07-02 DIAGNOSIS — L589 Radiodermatitis, unspecified: Secondary | ICD-10-CM | POA: Insufficient documentation

## 2012-07-02 DIAGNOSIS — D0590 Unspecified type of carcinoma in situ of unspecified breast: Secondary | ICD-10-CM | POA: Insufficient documentation

## 2012-07-02 DIAGNOSIS — F411 Generalized anxiety disorder: Secondary | ICD-10-CM | POA: Insufficient documentation

## 2012-07-02 DIAGNOSIS — C50919 Malignant neoplasm of unspecified site of unspecified female breast: Secondary | ICD-10-CM

## 2012-07-02 NOTE — Progress Notes (Addendum)
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 I saw her at the [facility] on 06/19/2012. She presented with suspicious microcalcifications within the upper-outer quadrant of the right breast.  Stereotactic biopsy on 06/11/2012 was diagnostic for high-grade DCIS with calcifications and necrosis. Breast MR on 06/18/2012 showed clump nodular enhancement within the upper-outer quadrant along with clip artifact. She was seen along with Dr. Jamey Ripa and Dr. Donnie Coffin. On 06/21/2012 she underwent a right partial mastectomy was found have a 0.7 cm high-grade DCIS with necrosis. The closest surgical margin was 1.0 cm, superiorly and inferiorly. As mentioned previously, she was hormone receptor positive. 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 She is entertaining the possibility of going on the B. 43 protocol.  Physical examination: Alert and oriented. Wt Readings from Last 3 Encounters:  07/02/12 193 lb 12.8 oz (87.907 kg)  07/01/12 196 lb (88.905 kg)  06/19/12 200 lb (90.719 kg)   Temp Readings from Last 3 Encounters:  07/02/12 98.3 F (36.8 C) Oral  07/01/12 96.8 F (36 C) Temporal  06/21/12 97 F (36.1 C)    BP Readings from Last 3 Encounters:  07/02/12 119/77  07/01/12 118/80  06/21/12 128/61   Pulse Readings from Last 3 Encounters:  07/02/12 80  07/01/12 60  06/21/12 60   Head and neck examination: Grossly unremarkable. Nodes: Without palpable cervical, supraclavicular, or axillary lymphadenopathy. Chest: Lungs clear. Breasts: There is a partial mastectomy wound along the upper-outer quadrant of the right breast extending from 10:00 to 1:00. The wound is healing well and there is the usual degree of underlying induration. Cosmesis is excellent. Left breast without masses or lesions. Extremities without  edema.  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 She desires breast preservation and her surgical margins are excellent. However, I would like to obtain a baseline right breast mammogram in early September to confirm removal of all suspicious microcalcifications. She'll meet with one of our protocol nurses to discuss the B. 43 protocol which she visits Dr. Donnie Coffin for a followup visit next week. I discussed the potential acute and late toxicities of radiation therapy and she wishes to proceed as outlined. Consent is signed today. [facility] I will then get her set up for simulation/treatment planning.  Plan: As discussed above. She'll meet with Dr. Donnie Coffin next week.  30 minutes was spent face-to-face with the patient, primarily counseling the patient and coordinating her care.

## 2012-07-02 NOTE — Progress Notes (Addendum)
Pt c/o soreness, pain w/pressure on right breast. She is 2 weeks s/p right lumpectomy. She states she "has blood under her skin"; saw Dr Jamey Ripa yesterday and was told she "is doing fine".  Distress score 8; spoke w/Abigail Ramond Dial, SW to inform. She will call pt.

## 2012-07-02 NOTE — Progress Notes (Signed)
Please see the Nurse Progress Note in the MD Initial Consult Encounter for this patient. 

## 2012-07-04 ENCOUNTER — Telehealth (INDEPENDENT_AMBULATORY_CARE_PROVIDER_SITE_OTHER): Payer: Self-pay | Admitting: General Surgery

## 2012-07-04 NOTE — Telephone Encounter (Signed)
Pt calling to ask Dr. Jamey Ripa to extend her leave.  She had lumpectomy on 06/21/12.  She has been doing light housework this week and is very sore and tired.  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  Her job has her walking a lot, carrying her computer.  She is up and down all day, and returns email all evening at home.  She just doesn't think she can perform her job at this time.  Pt is requesting continued leave from work through 08/02/12, when she begins her radiation therapy with Dr. Dayton Scrape.  As it stands, she is to RTW on Monday, 07/08/12, but says she cannot perform her duties.  Please advise.

## 2012-07-05 ENCOUNTER — Encounter (INDEPENDENT_AMBULATORY_CARE_PROVIDER_SITE_OTHER): Payer: Self-pay | Admitting: General Surgery

## 2012-07-05 NOTE — Addendum Note (Signed)
Encounter addended by: Delynn Flavin, RN on: 07/05/2012  7:05 PM<BR>     Documentation filed: Charges VN

## 2012-07-09 ENCOUNTER — Ambulatory Visit (HOSPITAL_BASED_OUTPATIENT_CLINIC_OR_DEPARTMENT_OTHER): Payer: BC Managed Care – PPO | Admitting: Oncology

## 2012-07-09 ENCOUNTER — Encounter: Payer: Self-pay | Admitting: *Deleted

## 2012-07-09 ENCOUNTER — Other Ambulatory Visit (HOSPITAL_BASED_OUTPATIENT_CLINIC_OR_DEPARTMENT_OTHER): Payer: BC Managed Care – PPO | Admitting: Lab

## 2012-07-09 ENCOUNTER — Telehealth: Payer: Self-pay | Admitting: *Deleted

## 2012-07-09 ENCOUNTER — Other Ambulatory Visit: Payer: Self-pay | Admitting: Family

## 2012-07-09 VITALS — BP 109/70 | HR 69 | Temp 98.0°F | Resp 20 | Ht 66.0 in | Wt 197.9 lb

## 2012-07-09 DIAGNOSIS — F411 Generalized anxiety disorder: Secondary | ICD-10-CM

## 2012-07-09 DIAGNOSIS — C50419 Malignant neoplasm of upper-outer quadrant of unspecified female breast: Secondary | ICD-10-CM

## 2012-07-09 DIAGNOSIS — D0590 Unspecified type of carcinoma in situ of unspecified breast: Secondary | ICD-10-CM

## 2012-07-09 DIAGNOSIS — Z170 Estrogen receptor positive status [ER+]: Secondary | ICD-10-CM

## 2012-07-09 LAB — COMPREHENSIVE METABOLIC PANEL (CC13)
ALT: 19 U/L (ref 0–55)
AST: 28 U/L (ref 5–34)
Alkaline Phosphatase: 95 U/L (ref 40–150)
Glucose: 105 mg/dl — ABNORMAL HIGH (ref 70–99)
Potassium: 4.3 mEq/L (ref 3.5–5.1)
Sodium: 137 mEq/L (ref 136–145)
Total Bilirubin: 0.5 mg/dL (ref 0.20–1.20)

## 2012-07-09 LAB — CBC WITH DIFFERENTIAL/PLATELET
EOS%: 1.8 % (ref 0.0–7.0)
Eosinophils Absolute: 0.1 10*3/uL (ref 0.0–0.5)
LYMPH%: 39.7 % (ref 14.0–49.7)
MCH: 30.5 pg (ref 25.1–34.0)
MCHC: 33.9 g/dL (ref 31.5–36.0)
MCV: 89.8 fL (ref 79.5–101.0)
MONO%: 7.6 % (ref 0.0–14.0)
NEUT#: 4 10*3/uL (ref 1.5–6.5)
Platelets: 300 10*3/uL (ref 145–400)
RBC: 3.8 10*6/uL (ref 3.70–5.45)
RDW: 13.1 % (ref 11.2–14.5)

## 2012-07-09 MED ORDER — TRAMADOL HCL 50 MG PO TABS: 50.0000 mg | ORAL_TABLET | Freq: Four times a day (QID) | ORAL | Status: DC | PRN

## 2012-07-09 MED ORDER — ZOLPIDEM TARTRATE 5 MG PO TABS: 5.0000 mg | ORAL_TABLET | Freq: Every evening | ORAL | Status: DC | PRN

## 2012-07-09 NOTE — Progress Notes (Signed)
Hematology and Oncology Follow Up Visit  [name] [id number] 1961/10/04 51 y.o. 07/09/2012 7:45 PM   DIAGNOSIS:  DCIS   PAST THERAPY:  Lumpectomy 06/21/2012 4.7 cm focus of DCIS ER +35%, PR +5% HER-2 pending  Interim History:  As above, patient has recovered fairly well. She is having some discomfort in her axilla. She has difficulty sleeping as trouble with hot flashes. She also has an anxiety related to her recent diagnosis. She has ultimately agreed to go on the B. 43 study and has signed consent for HER-2 testing.  Medications: I have reviewed the patient's current medications.  Allergies:  Allergies  Allergen Reactions  . Zithromax (Azithromycin) Nausea And Vomiting  . Erythromycin Nausea Only    Stomach cramps    Past Medical History, Surgical history, Social history, and Family History were reviewed and updated.  Review of Systems: Constitutional:  Negative for fever, chills, night sweats, anorexia, weight loss, pain. Cardiovascular: no chest pain or dyspnea on exertion Respiratory: no cough, shortness of breath, or wheezing Neurological: negative Dermatological: negative ENT: negative Skin Gastrointestinal: negative Genito-Urinary: negative Hematological and Lymphatic: negative Breast: negative Musculoskeletal: negative Remaining ROS negative.  Physical Exam:  Blood pressure 109/70, pulse 69, temperature 98 F (36.7 C), resp. rate 20, height 5\' 6"  (1.676 m), weight 197 lb 14.4 oz (89.767 kg).  ECOG: 0      Lab Results: Lab Results  Component Value Date   WBC 8.1 07/09/2012   HGB 11.6 07/09/2012   HCT 34.2* 07/09/2012   MCV 89.8 07/09/2012   PLT 300 07/09/2012     Chemistry      Component Value Date/Time   NA 137 07/09/2012 1226   NA 131* 06/19/2012 0807   K 4.3 07/09/2012 1226   K 3.7 06/19/2012 0807   CL 102 07/09/2012 1226   CL 92* 06/19/2012 0807   CO2 24 07/09/2012 1226   CO2 28 06/19/2012 0807   BUN 16.0 07/09/2012 1226   BUN 16 06/19/2012 0807     CREATININE 0.8 07/09/2012 1226   CREATININE 0.68 06/19/2012 0807      Component Value Date/Time   CALCIUM 9.8 07/09/2012 1226   CALCIUM 10.4 06/19/2012 0807   ALKPHOS 95 07/09/2012 1226   ALKPHOS 103 06/19/2012 0807   AST 28 07/09/2012 1226   AST 41* 06/19/2012 0807   ALT 19 07/09/2012 1226   ALT 24 06/19/2012 0807   BILITOT 0.50 07/09/2012 1226   BILITOT 0.4 06/19/2012 5366       Radiological Studies:  Mr Breast Bilateral W Wo Contrast  06/18/2012   *RADIOLOGY REPORT*  Clinical Data: Biopsy-proven high-grade DCIS manifesting as mammographically detected calcifications in the right upper outer quadrant.  BILATERAL BREAST MRI WITH AND WITHOUT CONTRAST  Technique: Multiplanar, multisequence MR images of both breasts were obtained prior to and following the intravenous administration of 18ml of Multihance.  Three dimensional images were evaluated at the independent DynaCad workstation.  Comparison:  Prior mammograms  Findings: Minimal post biopsy change and clip artifact are noted in the right upper outer quadrant at the site of biopsy-proven DCIS. Adjacent to the biopsy cavity, there is clumped nodular enhancement with plateau type enhancement kinetics measuring 1.6 x 1.2 x 1.0 cm.  This corresponds to the area of biopsy-proven DCIS.  No abnormal T2-weighted hyperintensity or lymphadenopathy is seen elsewhere in either side.  No other area of abnormal enhancement, allowing for moderate background type parenchymal enhancement pattern which may decrease the sensitivity for detection of malignancy.  IMPRESSION: Solitary abnormal clumped nodular enhancement right breast upper outer quadrant with central clip artifact, corresponding to biopsy- proven DCIS.  No MRI evidence for multifocal or multicentric or contralateral malignancy.  RECOMMENDATION: BI-RADS CATEGORY 6:  Known biopsy-proven malignancy - appropriate action should be taken.  Recommendation:  Treatment plan  THREE-DIMENSIONAL MR IMAGE RENDERING ON  INDEPENDENT WORKSTATION:  Three-dimensional MR images were rendered by post-processing of the original MR data on an independent workstation.  The three- dimensional MR images were interpreted, and findings were reported in the accompanying complete MRI report for this study.  Original Report Authenticated By: Harrel Lemon, M.D.   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  I spoke with the patient on 06/12/2012 at to 1320 hours.  She described mild pain at the biopsy site, controlled by acetaminophen.  She also described bruising in the right breast. She denied using from the incision site.  I informed her to contact me in the event that the pain became severe or if she began bleeding from the incision site.  The patient has been scheduled for the multidisciplinary Breast Clinic on Wednesday, August 7.  She will have breast MRI on Tuesday, August 6.  I telephoned these results to [facility], Dr. Dennie Bible nurse, on 06/13/2012 at 1030 hours.  Addended by:  Arnell Sieving, M.D.  **END ADDENDUM** SIGNED BY: Arnell Sieving, M.D.   06/11/2012    *RADIOLOGY REPORT*  Clinical Data:  Suspicious calcifications in the upper outer quadrant of the right breast.  STEREOTACTIC-GUIDED VACUUM ASSISTED BIOPSY OF THE RIGHT BREAST AND SPECIMEN RADIOGRAPH  Comparison: Diagnostic mammograms earlier same date and dating back to December, 2009.  I met with the patient and we discussed the procedure of stereotactic-guided biopsy, including benefits and alternatives. We discussed the high likelihood of a successful procedure. We discussed the risks of the procedure, including infection, bleeding, tissue injury, clip migration, and inadequate sampling. Informed, written consent was given.  Using sterile technique, 2% lidocaine, stereotactic guidance, and a 9 gauge vacuum assisted device, biopsy  was performed of the calcifications in the upper outer quadrant of the right breast. Specimen radiograph was performed, showing calcifications in several of the cores.  At the conclusion of the procedure, an S-mark tissue marker clip was deployed into the biopsy cavity.  Follow-up 2-view mammogram confirmed clip placement in the appropriate position in the upper outer quadrant of the right breast in the area of calcifications.  IMPRESSION: Stereotactic-guided biopsy of calcifications in the upper outer quadrant of the right breast.  No apparent complications.  This was discussed with Dr. Marcelle Overlie by telephone on 06/11/2012 at 1530 hours.  Original Report Authenticated By: Arnell Sieving, M.D.   Mm Breast Surgical Specimen  06/21/2012  *RADIOLOGY REPORT*  Clinical Data:  carcinoma right breast 12:00 position  NEEDLE LOCALIZATION WITH MAMMOGRAPHIC GUIDANCE AND SPECIMEN RADIOGRAPH  Comparison:  Previous exams.  Patient presents for needle localization prior to lumpectomy.  I met with the patient and we discussed the procedure of needle localization including benefits and alternatives. We discussed the high likelihood of a successful procedure. We discussed the risks of the procedure, including infection, bleeding, tissue injury, and further surgery. Informed, written consent was given.  Using mammographic guidance, sterile technique, 2% lidocaine and a 7cm modified Kopans needle, the remaining calcifications and clip were localized using a craniocaudal approach.  The films are marked  for Dr. Jamey Ripa.  Specimen radiograph was performed at [facility], and confirms remaining calcifications and clip present in the tissue sample. The specimen is marked for pathology.  IMPRESSION: Needle localization right breast.  No apparent complications.  Original Report Authenticated By: Otilio Carpen, M.D.   Mm Breast Surgical Specimen  06/13/2012  **ADDENDUM** CREATED: 06/13/2012 1030 hours  Pathology returned as high-grade  ductal carcinoma in situ, concordant with imaging findings.  I spoke with the patient on 06/12/2012 at to 1320 hours.  She described mild pain at the biopsy site, controlled by acetaminophen.  She also described bruising in the right breast. She denied using from the incision site.  I informed her to contact me in the event that the pain became severe or if she began bleeding from the incision site.  The patient has been scheduled for the multidisciplinary Breast Clinic on Wednesday, August 7.  She will have breast MRI on Tuesday, August 6.  I telephoned these results to [facility], Dr. Dennie Bible nurse, on 06/13/2012 at 1030 hours.  Addended by:  Arnell Sieving, M.D.  **END ADDENDUM** SIGNED BY: Arnell Sieving, M.D.   06/11/2012    *RADIOLOGY REPORT*  Clinical Data:  Suspicious calcifications in the upper outer quadrant of the right breast.  STEREOTACTIC-GUIDED VACUUM ASSISTED BIOPSY OF THE RIGHT BREAST AND SPECIMEN RADIOGRAPH  Comparison: Diagnostic mammograms earlier same date and dating back to December, 2009.  I met with the patient and we discussed the procedure of stereotactic-guided biopsy, including benefits and alternatives. We discussed the high likelihood of a successful procedure. We discussed the risks of the procedure, including infection, bleeding, tissue injury, clip migration, and inadequate sampling. Informed, written consent was given.  Using sterile technique, 2% lidocaine, stereotactic guidance, and a 9 gauge vacuum assisted device, biopsy was performed of the calcifications in the upper outer quadrant of the right breast. Specimen radiograph was performed, showing calcifications in several of the cores.  At the conclusion of the procedure, an S-mark tissue marker clip was deployed into the biopsy cavity.  Follow-up 2-view mammogram confirmed clip placement in the appropriate position in the upper outer quadrant of the right breast in the area of calcifications.  IMPRESSION:  Stereotactic-guided biopsy of calcifications in the upper outer quadrant of the right breast.  No apparent complications.  This was discussed with Dr. Marcelle Overlie by telephone on 06/11/2012 at 1530 hours.  Original Report Authenticated By: Arnell Sieving, M.D.   Mm Breast Wire Localization Right  06/21/2012  *RADIOLOGY REPORT*  Clinical Data:  carcinoma right breast 12:00 position  NEEDLE LOCALIZATION WITH MAMMOGRAPHIC GUIDANCE AND SPECIMEN RADIOGRAPH  Comparison:  Previous exams.  Patient presents for needle localization prior to lumpectomy.  I met with the patient and we discussed the procedure of needle localization including benefits and alternatives. We discussed the high likelihood of a successful procedure. We discussed the risks of the procedure, including infection, bleeding, tissue injury, and further surgery. Informed, written consent was given.  Using mammographic guidance, sterile technique, 2% lidocaine and a 7cm modified Kopans needle, the remaining calcifications and clip were localized using a craniocaudal approach.  The films are marked for Dr. Jamey Ripa.  Specimen radiograph was performed at [facility], and confirms remaining calcifications and clip present in the tissue sample. The specimen is marked for pathology.  IMPRESSION: Needle localization right breast.  No apparent complications.  Original Report Authenticated By: Otilio Carpen, M.D.   Mm Digital Diagnostic Bilat  06/11/2012   *RADIOLOGY REPORT*  Clinical Data:  Short-term interval follow-up of right breast calcifications.  Annual reevaluation, left breast.  DIGITAL DIAGNOSTIC BILATERAL MAMMOGRAM WITH CAD  Comparison:  12/01/2011, 05/26/2011, 05/09/2011, dating back to 11/09/2008.  Findings:  CC and MLO views of both breasts and spot magnification views of the right breast calcifications were obtained. Scattered fibroglandular breast tissue, unchanged. There has been increase in the number of the calcifications in the upper outer  quadrant right breast since the examination 6 months ago.  The calcifications are pleomorphic, with punctate and linear forms.  The maximum in size of the cluster approximates 16 mm.  No new masses, architectural distortion, or suspicious calcifications elsewhere in either breast. Mammographic images were processed with CAD.  IMPRESSION:  1.  Interval increase in the number of pleomorphic calcifications in the upper outer quadrant of the right breast since the examination 6 months ago. 2.  No mammographic evidence of malignancy, left breast.  RECOMMENDATION: Stereotactic core needle biopsy of the calcifications was discussed with the patient, in lieu of the increasing number and increasing pleomorphism.  She has agreed to proceed, this was performed subsequently and will be reported separately.  BI-RADS CATEGORY 4:  Suspicious abnormality - biopsy should be considered.  Original Report Authenticated By: Arnell Sieving, M.D.     IMPRESSIONS AND PLAN: A 51 y.o. female with   History of DCIS status post lumpectomy ER/PR positive and HER-2 results. I prescribed Ultram as well and without other pain and poor sleep. I counseled her about her diagnosis as well. I will plan to see her back in followup after she is going through radiation therapy. I did describe adjuvant tamoxifen therapy to her and some of the side effects associated with this. I've also recommended peridin c  for hot flashes. She is a prediabetic I've also counseled her about weight loss as well.  Spent more than half the time coordinating care, as well as discussion of BMI and its implications.      Pierce Crane 8/27/20137:45 PM Cell [phone]

## 2012-07-09 NOTE — Telephone Encounter (Signed)
left voice message to inform the patient of the new date and time on 09-2012

## 2012-07-09 NOTE — Patient Instructions (Addendum)
   PERIDIN C - FOR HOT FLASHES, 2 PILLS 2-3 TIMES PER DAY.

## 2012-07-11 ENCOUNTER — Ambulatory Visit: Payer: BC Managed Care – PPO | Admitting: Radiation Oncology

## 2012-07-11 ENCOUNTER — Ambulatory Visit: Payer: BC Managed Care – PPO

## 2012-07-25 ENCOUNTER — Ambulatory Visit
Admission: RE | Admit: 2012-07-25 | Discharge: 2012-07-25 | Disposition: A | Discharge status: Home / Self Care | Payer: BC Managed Care – PPO | Source: Ambulatory Visit | Attending: Radiation Oncology | Admitting: Radiation Oncology

## 2012-07-25 DIAGNOSIS — C50419 Malignant neoplasm of upper-outer quadrant of unspecified female breast: Secondary | ICD-10-CM

## 2012-07-29 ENCOUNTER — Encounter: Payer: BC Managed Care – PPO | Admitting: *Deleted

## 2012-07-29 ENCOUNTER — Ambulatory Visit
Admission: RE | Admit: 2012-07-29 | Discharge: 2012-07-29 | Disposition: A | Discharge status: Home / Self Care | Payer: BC Managed Care – PPO | Source: Ambulatory Visit | Attending: Radiation Oncology | Admitting: Radiation Oncology

## 2012-07-29 DIAGNOSIS — C50419 Malignant neoplasm of upper-outer quadrant of unspecified female breast: Secondary | ICD-10-CM

## 2012-07-29 NOTE — Progress Notes (Signed)
Simulation/treatment planning note:  The patient was taken to the CT simulator. She is posterior breast board with a custom neck mold for immobilization. Her field borders were marked with radiopaque wires. I contoured her tumor bed in addition to contouring of her partial mastectomy scar and normal shredding structures. She is set up to medial and lateral right breast tangents. 2 separate multileaf collimator she designed to conform the field. Dosimetry and an isodose plan are requested. I prescribing 5000 cGy in 25 sessions to be followed by a right breast boost of 1000 cGy 5 sessions.

## 2012-07-30 ENCOUNTER — Other Ambulatory Visit: Payer: Self-pay | Admitting: Emergency Medicine

## 2012-07-30 NOTE — Telephone Encounter (Signed)
Patient calls with complaints of nausea and diarrhea onset this am.  Patient also states that the Ambien was giving her bad dreams and she stopped taking it.  She states that the Tramadol isn't helping her pain.  Patient has an appointment with her PCP today at 2:30 for these issues.  Dr Donnie Coffin notified.

## 2012-08-02 ENCOUNTER — Encounter: Payer: BC Managed Care – PPO | Admitting: *Deleted

## 2012-08-02 ENCOUNTER — Ambulatory Visit (HOSPITAL_BASED_OUTPATIENT_CLINIC_OR_DEPARTMENT_OTHER): Payer: BC Managed Care – PPO | Admitting: Lab

## 2012-08-02 ENCOUNTER — Other Ambulatory Visit: Payer: Self-pay | Admitting: *Deleted

## 2012-08-02 DIAGNOSIS — C50419 Malignant neoplasm of upper-outer quadrant of unspecified female breast: Secondary | ICD-10-CM

## 2012-08-05 ENCOUNTER — Encounter: Payer: Self-pay | Admitting: *Deleted

## 2012-08-05 ENCOUNTER — Telehealth: Payer: Self-pay | Admitting: *Deleted

## 2012-08-05 ENCOUNTER — Ambulatory Visit
Admission: RE | Admit: 2012-08-05 | Discharge: 2012-08-05 | Disposition: A | Discharge status: Home / Self Care | Payer: BC Managed Care – PPO | Source: Ambulatory Visit | Attending: Radiation Oncology | Admitting: Radiation Oncology

## 2012-08-05 DIAGNOSIS — C50419 Malignant neoplasm of upper-outer quadrant of unspecified female breast: Secondary | ICD-10-CM

## 2012-08-05 MED ORDER — ALRA NON-METALLIC DEODORANT (RAD-ONC)
1.0000 "application " | Freq: Once | TOPICAL | Status: AC
  Administered 2012-08-05: 1 via TOPICAL

## 2012-08-05 MED ORDER — RADIAPLEXRX EX GEL
Freq: Once | CUTANEOUS | Status: AC
  Administered 2012-08-05: 11:00:00 via TOPICAL

## 2012-08-05 NOTE — Telephone Encounter (Signed)
Called patient to relay to her the paperwork cannot be filled out until she actually starts rad txs and has symptoms, patient then stated"I have been fatigued all along, very fatigued, I try and do a 15 minute walk on the treadmill every morning, and drink a health protein  Shake, with all kinds of vegetables ,kale,spinach, fruits, etc. And I have to lay down and take naps, I tried to walk my dog after this today and I got too tired and had to coe back and take a nap again. I will relay this information to MD and will discuss further tomorrow after her 1st rad tx,  3:05 PM

## 2012-08-05 NOTE — Progress Notes (Signed)
Simulation verification note: The patient underwent simulation verification for treatment to her right breast. Her isocenter is in good position and the multileaf collimators contoured the treatment volume appropriately. 

## 2012-08-05 NOTE — Telephone Encounter (Signed)
Per voicemail from [employer], I have scheduled appt for this week.  JMW

## 2012-08-05 NOTE — Progress Notes (Signed)
Post sim teaching, radiation therapy and you book given with skin products, alra deodorant, radiaplex gel, schedule, business card, sees Md [place] RN weekly and prn, apply radiaplex gelnot 4 hours prior to rad tx, use after rad tx and bedtime, alra after rad tx and prn Teach back given by patient, also patient gave papers to fill out by MD to be out of work  During her treatments will give to her once filled out by Md, left papers on Md desk with note 11:15 AM

## 2012-08-06 ENCOUNTER — Ambulatory Visit
Admission: RE | Admit: 2012-08-06 | Discharge: 2012-08-06 | Disposition: A | Discharge status: Home / Self Care | Payer: BC Managed Care – PPO | Source: Ambulatory Visit | Attending: Radiation Oncology | Admitting: Radiation Oncology

## 2012-08-07 ENCOUNTER — Ambulatory Visit
Admission: RE | Admit: 2012-08-07 | Discharge: 2012-08-07 | Disposition: A | Discharge status: Home / Self Care | Payer: BC Managed Care – PPO | Source: Ambulatory Visit | Attending: Radiation Oncology | Admitting: Radiation Oncology

## 2012-08-08 ENCOUNTER — Ambulatory Visit: Payer: BC Managed Care – PPO

## 2012-08-08 ENCOUNTER — Ambulatory Visit
Admission: RE | Admit: 2012-08-08 | Discharge: 2012-08-08 | Disposition: A | Discharge status: Home / Self Care | Payer: BC Managed Care – PPO | Source: Ambulatory Visit | Attending: Radiation Oncology | Admitting: Radiation Oncology

## 2012-08-08 ENCOUNTER — Encounter: Payer: Self-pay | Admitting: *Deleted

## 2012-08-08 ENCOUNTER — Telehealth: Payer: Self-pay | Admitting: Oncology

## 2012-08-08 VITALS — BP 140/70 | HR 75 | Temp 97.8°F

## 2012-08-08 DIAGNOSIS — C50419 Malignant neoplasm of upper-outer quadrant of unspecified female breast: Secondary | ICD-10-CM

## 2012-08-08 DIAGNOSIS — C50919 Malignant neoplasm of unspecified site of unspecified female breast: Secondary | ICD-10-CM

## 2012-08-08 MED ORDER — DIPHENHYDRAMINE HCL 25 MG PO CAPS
50.0000 mg | ORAL_CAPSULE | Freq: Once | ORAL | Status: AC
  Administered 2012-08-08: 50 mg via ORAL

## 2012-08-08 MED ORDER — ACETAMINOPHEN 325 MG PO TABS
650.0000 mg | ORAL_TABLET | Freq: Once | ORAL | Status: AC
  Administered 2012-08-08: 650 mg via ORAL

## 2012-08-08 MED ORDER — SODIUM CHLORIDE 0.9 % IV SOLN
Freq: Once | INTRAVENOUS | Status: AC
  Administered 2012-08-08: 11:00:00 via INTRAVENOUS

## 2012-08-08 MED ORDER — TRASTUZUMAB CHEMO INJECTION 440 MG FOR NSABP B-43
8.0000 mg/kg | Freq: Once | INTRAVENOUS | Status: AC
  Administered 2012-08-08: 718 mg via INTRAVENOUS
  Filled 2012-08-08: qty 34.19

## 2012-08-08 NOTE — Telephone Encounter (Signed)
Added appts for 10/17. Pt not contacted. nikki in research made aware.

## 2012-08-08 NOTE — Progress Notes (Signed)
08/08/12 at 9:13am - cycle 1, day 1 of trastuzumab / day 3 of radiation- The pt is in the cancer center this am for her 3rd day of radiation and her first dose of trastuzumab on the B-43 study.  The pt states she is ready for her treatments today.  The research nurse asked the pt some questions in order to complete the pt's on-study forms.  The pt confirmed that she takes medications for her hypertension and her hyperlipidemia.  She said that she is not on any medications for her diabetes at study entry.  The research nurse spoke to Maralyn Sago, infusion nurse, about the pt and her first treatment of trastuzumab.  A sign regarding the trastuzumab administration was given to the infusion nurse for reference.  The pt's treatment plan was approved and signed by Dr. Donnie Coffin.  The pharmacy is aware that she is a research protocol patient and will receive investigational product.

## 2012-08-08 NOTE — Patient Instructions (Addendum)

## 2012-08-09 ENCOUNTER — Ambulatory Visit
Admission: RE | Admit: 2012-08-09 | Discharge: 2012-08-09 | Disposition: A | Discharge status: Home / Self Care | Payer: BC Managed Care – PPO | Source: Ambulatory Visit | Attending: Radiation Oncology | Admitting: Radiation Oncology

## 2012-08-09 ENCOUNTER — Encounter: Payer: Self-pay | Admitting: *Deleted

## 2012-08-09 ENCOUNTER — Telehealth: Payer: Self-pay | Admitting: *Deleted

## 2012-08-09 NOTE — Progress Notes (Signed)
08/09/12 - 9:18am NSABP B-43 cycle 1, day 2 follow up- The pt was into the cancer center this am for her day 4 radiation treatment.  She states she tolerated her first dose of trastuzumab well.  She denied any problems during or after her infusion.  She said that she slept most of the afternoon after she got home yesterday.  She also reported a good night's sleep as well.  The pt received 50 mg of diphenhydramine as premedication before her trastuzumab infusion yesterday.  The pt said that she feels fine this morning with no reported adverse events.  The pt was given her appointments for her next trastuzumab infusion in 3 weeks.

## 2012-08-09 NOTE — Telephone Encounter (Signed)
Chemo follow up call per Marian Sorrow, RN with research.

## 2012-08-12 ENCOUNTER — Encounter: Payer: Self-pay | Admitting: Radiation Oncology

## 2012-08-12 ENCOUNTER — Ambulatory Visit
Admission: RE | Admit: 2012-08-12 | Discharge: 2012-08-12 | Disposition: A | Discharge status: Home / Self Care | Payer: BC Managed Care – PPO | Source: Ambulatory Visit | Attending: Radiation Oncology | Admitting: Radiation Oncology

## 2012-08-12 VITALS — BP 147/68 | HR 64 | Temp 98.0°F | Resp 20 | Wt 195.0 lb

## 2012-08-12 DIAGNOSIS — C50419 Malignant neoplasm of upper-outer quadrant of unspecified female breast: Secondary | ICD-10-CM

## 2012-08-12 NOTE — Progress Notes (Signed)
Weekly Management Note:  Site:R Breast Current Dose:  1000  cGy Projected Dose: 6000  cGy  Narrative: The patient is seen today for routine under treatment assessment. CBCT/MVCT images/port films were reviewed. The chart was reviewed.   She remains anxious and fatigue. She saw Dr. Dewain Penning, and was placed on Xanax when necessary. She'll see her again tomorrow. She uses Radioplex gel.  Physical Examination:  Filed Vitals:   08/12/12 0956  BP: 147/68  Pulse: 64  Temp: 98 F (36.7 C)  Resp: 20  .  Weight: 195 lb (88.451 kg). No skin changes along the right breast.  Impression: Tolerating radiation therapy well. She certainly has a great deal of anxiety. I'm not sure if she is also somewhat depressed. I offered her counseling with Dr. Merry Proud, and  she'll think about it.  Plan: Continue radiation therapy as planned.

## 2012-08-12 NOTE — Progress Notes (Signed)
Patient here for weekly r breast  rad txs, 5/30 txs completed, patient taking xanax prn sleep,helps, defers to [facility] Dr[facility] Noe [facility], seeing primary Md for this, right breast no skin changes,using radiaplex gel bid, pt denys pain at present 10:01 AM

## 2012-08-13 ENCOUNTER — Ambulatory Visit
Admission: RE | Admit: 2012-08-13 | Discharge: 2012-08-13 | Disposition: A | Discharge status: Home / Self Care | Payer: BC Managed Care – PPO | Source: Ambulatory Visit | Attending: Radiation Oncology | Admitting: Radiation Oncology

## 2012-08-14 ENCOUNTER — Ambulatory Visit
Admission: RE | Admit: 2012-08-14 | Discharge: 2012-08-14 | Disposition: A | Discharge status: Home / Self Care | Payer: BC Managed Care – PPO | Source: Ambulatory Visit | Attending: Radiation Oncology | Admitting: Radiation Oncology

## 2012-08-15 ENCOUNTER — Ambulatory Visit
Admission: RE | Admit: 2012-08-15 | Discharge: 2012-08-15 | Disposition: A | Discharge status: Home / Self Care | Payer: BC Managed Care – PPO | Source: Ambulatory Visit | Attending: Radiation Oncology | Admitting: Radiation Oncology

## 2012-08-16 ENCOUNTER — Ambulatory Visit
Admission: RE | Admit: 2012-08-16 | Discharge: 2012-08-16 | Disposition: A | Discharge status: Home / Self Care | Payer: BC Managed Care – PPO | Source: Ambulatory Visit | Attending: Radiation Oncology | Admitting: Radiation Oncology

## 2012-08-19 ENCOUNTER — Encounter: Payer: Self-pay | Admitting: Radiation Oncology

## 2012-08-19 ENCOUNTER — Ambulatory Visit
Admission: RE | Admit: 2012-08-19 | Discharge: 2012-08-19 | Disposition: A | Discharge status: Home / Self Care | Payer: BC Managed Care – PPO | Source: Ambulatory Visit | Attending: Radiation Oncology | Admitting: Radiation Oncology

## 2012-08-19 VITALS — BP 123/81 | HR 69 | Temp 97.7°F | Resp 20 | Wt 193.4 lb

## 2012-08-19 DIAGNOSIS — C50419 Malignant neoplasm of upper-outer quadrant of unspecified female breast: Secondary | ICD-10-CM

## 2012-08-19 NOTE — Progress Notes (Signed)
Weekly Management Note:  Site:R Breast Current Dose:  2000  cGy Projected Dose: 6000  cGy  Narrative: The patient is seen today for routine under treatment assessment. CBCT/MVCT images/port films were reviewed. The chart was reviewed.   No new complaints today. She saw her primary care physician who has her on Xanax for anxiety. She declines consultation with Dr. Noe Gens at this point in time. She uses Radioplex gel.  Physical Examination:  Filed Vitals:   08/19/12 0953  BP: 123/81  Pulse: 69  Temp: 97.7 F (36.5 C)  Resp: 20  .  Weight: 193 lb 6.4 oz (87.726 kg). No significant skin changes.  Impression: Tolerating radiation therapy well.  Plan: Continue radiation therapy as planned.

## 2012-08-19 NOTE — Progress Notes (Signed)
Patient here for weekly radiation treatments, 10/30 completed, , very slight erythema on right breast, no c/o pain at present, in good spirits this am 9:54 AM

## 2012-08-20 ENCOUNTER — Ambulatory Visit
Admission: RE | Admit: 2012-08-20 | Discharge: 2012-08-20 | Disposition: A | Discharge status: Home / Self Care | Payer: BC Managed Care – PPO | Source: Ambulatory Visit | Attending: Radiation Oncology | Admitting: Radiation Oncology

## 2012-08-21 ENCOUNTER — Ambulatory Visit
Admission: RE | Admit: 2012-08-21 | Discharge: 2012-08-21 | Disposition: A | Discharge status: Home / Self Care | Payer: BC Managed Care – PPO | Source: Ambulatory Visit | Attending: Radiation Oncology | Admitting: Radiation Oncology

## 2012-08-22 ENCOUNTER — Ambulatory Visit
Admission: RE | Admit: 2012-08-22 | Discharge: 2012-08-22 | Disposition: A | Discharge status: Home / Self Care | Payer: BC Managed Care – PPO | Source: Ambulatory Visit | Attending: Radiation Oncology | Admitting: Radiation Oncology

## 2012-08-23 ENCOUNTER — Ambulatory Visit
Admission: RE | Admit: 2012-08-23 | Discharge: 2012-08-23 | Disposition: A | Discharge status: Home / Self Care | Payer: BC Managed Care – PPO | Source: Ambulatory Visit | Attending: Radiation Oncology | Admitting: Radiation Oncology

## 2012-08-26 ENCOUNTER — Encounter: Payer: Self-pay | Admitting: Radiation Oncology

## 2012-08-26 ENCOUNTER — Ambulatory Visit
Admission: RE | Admit: 2012-08-26 | Discharge: 2012-08-26 | Disposition: A | Discharge status: Home / Self Care | Payer: BC Managed Care – PPO | Source: Ambulatory Visit | Attending: Radiation Oncology | Admitting: Radiation Oncology

## 2012-08-26 VITALS — BP 129/68 | HR 60 | Temp 97.7°F | Resp 20 | Wt 195.1 lb

## 2012-08-26 DIAGNOSIS — C50419 Malignant neoplasm of upper-outer quadrant of unspecified female breast: Secondary | ICD-10-CM

## 2012-08-26 NOTE — Progress Notes (Signed)
Complex simulation note: The patient underwent virtual simulation for her right breast boost. She was set up to a wedge pair. 2 separate multileaf collimators were designed to conform the field. I prescribing 1000 cGy in 5 sessions utilizing 6 MV photons. Dosimetry and an isodose plan is requested.

## 2012-08-26 NOTE — Progress Notes (Signed)
Weekly Management Note:  Site: Right breast Current Dose:  3000  cGy Projected Dose: 6000  cGy  Narrative: The patient is seen today for routine under treatment assessment. CBCT/MVCT images/port films were reviewed. The chart was reviewed.   She still doing well and uses Radioplex gel. She continues to have mild to moderate fatigue. She also occasionally has a sharp shooting pain within the right breast as expected.  Physical Examination:  Filed Vitals:   08/26/12 0907  BP: 129/68  Pulse: 60  Temp: 97.7 F (36.5 C)  Resp: 20  .  Weight: 195 lb 1.6 oz (88.497 kg). There is mild erythema the skin along the right breast with no areas of desquamation.  Impression: Tolerating radiation therapy well.  Plan: Continue radiation therapy as planned.

## 2012-08-26 NOTE — Progress Notes (Signed)
Patient  here weekly rad txs:15/30 right breast slight erythema , slight fatigued, still occasional shooting pain in right breast 9:09 AM

## 2012-08-27 ENCOUNTER — Ambulatory Visit
Admission: RE | Admit: 2012-08-27 | Discharge: 2012-08-27 | Disposition: A | Discharge status: Home / Self Care | Payer: BC Managed Care – PPO | Source: Ambulatory Visit | Attending: Radiation Oncology | Admitting: Radiation Oncology

## 2012-08-28 ENCOUNTER — Ambulatory Visit
Admission: RE | Admit: 2012-08-28 | Discharge: 2012-08-28 | Disposition: A | Discharge status: Home / Self Care | Payer: BC Managed Care – PPO | Source: Ambulatory Visit | Attending: Radiation Oncology | Admitting: Radiation Oncology

## 2012-08-29 ENCOUNTER — Ambulatory Visit
Admission: RE | Admit: 2012-08-29 | Discharge: 2012-08-29 | Disposition: A | Discharge status: Home / Self Care | Payer: BC Managed Care – PPO | Source: Ambulatory Visit | Attending: Radiation Oncology | Admitting: Radiation Oncology

## 2012-08-29 ENCOUNTER — Ambulatory Visit (HOSPITAL_BASED_OUTPATIENT_CLINIC_OR_DEPARTMENT_OTHER): Payer: BC Managed Care – PPO | Admitting: Oncology

## 2012-08-29 ENCOUNTER — Ambulatory Visit: Payer: Self-pay

## 2012-08-29 ENCOUNTER — Encounter: Payer: Self-pay | Admitting: *Deleted

## 2012-08-29 VITALS — BP 119/75 | HR 63 | Temp 97.5°F | Resp 20 | Ht 66.0 in | Wt 196.4 lb

## 2012-08-29 DIAGNOSIS — C50419 Malignant neoplasm of upper-outer quadrant of unspecified female breast: Secondary | ICD-10-CM

## 2012-08-29 DIAGNOSIS — C50919 Malignant neoplasm of unspecified site of unspecified female breast: Secondary | ICD-10-CM

## 2012-08-29 DIAGNOSIS — D0590 Unspecified type of carcinoma in situ of unspecified breast: Secondary | ICD-10-CM

## 2012-08-29 DIAGNOSIS — Z170 Estrogen receptor positive status [ER+]: Secondary | ICD-10-CM

## 2012-08-29 MED ORDER — SODIUM CHLORIDE 0.9 % IV SOLN
6.0000 mg/kg | Freq: Once | INTRAVENOUS | Status: AC
  Administered 2012-08-29: 539 mg via INTRAVENOUS
  Filled 2012-08-29: qty 25.67

## 2012-08-29 MED ORDER — SODIUM CHLORIDE 0.9 % IV SOLN
Freq: Once | INTRAVENOUS | Status: AC
  Administered 2012-08-29: 10:00:00 via INTRAVENOUS

## 2012-08-29 MED ORDER — DIPHENHYDRAMINE HCL 25 MG PO CAPS
50.0000 mg | ORAL_CAPSULE | Freq: Once | ORAL | Status: AC
  Administered 2012-08-29: 50 mg via ORAL

## 2012-08-29 MED ORDER — ACETAMINOPHEN 325 MG PO TABS
650.0000 mg | ORAL_TABLET | Freq: Once | ORAL | Status: AC
  Administered 2012-08-29: 650 mg via ORAL

## 2012-08-29 NOTE — Progress Notes (Signed)
Discharged at 11:15 ambulatory to home.  Awake, alert and oriented x 3.  Asked if she should apply ice to IV site after having a bruise and bump there last time.  Instructed to try this at 15 minute intervals but do not use heat.  Takes a baby aspirin daily.  Also instructed to apply pressure if site bleeds.

## 2012-08-29 NOTE — Patient Instructions (Signed)
Please take peridin c 2 pills up to 3x/day

## 2012-08-29 NOTE — Patient Instructions (Signed)
[facility] Discharge Instructions for Patients Receiving Chemotherapy  Today you received the following chemotherapy agents Herceptin   To help prevent nausea and vomiting after your treatment, we encourage you to take your flu-like symptom management medications tylenol, benadryl Begin taking it at anytime upon discharge and take it as often as prescribed for the next 72 hours.   If you develop nausea and vomiting that is not controlled by your nausea medication, call the clinic. If it is after clinic hours your family physician or the after hours number for the clinic or go to the Emergency Department.   BELOW ARE SYMPTOMS THAT SHOULD BE REPORTED IMMEDIATELY:  *FEVER GREATER THAN 100.5 F  *CHILLS WITH OR WITHOUT FEVER  NAUSEA AND VOMITING THAT IS NOT CONTROLLED WITH YOUR NAUSEA MEDICATION  *UNUSUAL SHORTNESS OF BREATH  *UNUSUAL BRUISING OR BLEEDING  TENDERNESS IN MOUTH AND THROAT WITH OR WITHOUT PRESENCE OF ULCERS  *URINARY PROBLEMS  *BOWEL PROBLEMS  UNUSUAL RASH Items with * indicate a potential emergency and should be followed up as soon as possible.  Please call to let a nurse know about any problems that you may have experienced. Feel free to call the clinic you have any questions or concerns. The clinic phone number is [phone].   I have been informed and understand all the instructions given to me. I know to contact the clinic, my physician, or go to the Emergency Department if any problems should occur. I do not have any questions at this time, but understand that I may call the clinic during office hours   should I have any questions or need assistance in obtaining follow up care.    __________________________________________  _____________  __________ Signature of Patient or Authorized Representative            Date                   Time    __________________________________________ Nurse's Signature

## 2012-08-29 NOTE — Progress Notes (Signed)
08/29/12 at 9:56am - NSABP B-43 Cycle 2, day 1 study notes- The pt was into the cancer center today for her 2nd dose of trastuzumab.  The pt's weight and vitals are stable.  The pt denies any problems following her first dose of trastuzumab.  She received her 18 th dose of radiation treatment this am.  The pt was seen and evaluated this am by Dr. Donnie Coffin. The pt states she has some mild fatigue and a mild headache daily.  Dr. Donnie Coffin said that it was probably a stress headache since she has to receive radiation daily. The research nurse gave Roz, infusion room nurse, the sign regarding the pt's trastuzumab administration today.  The pt will see Dr. Donnie Coffin after her radiation completion to discuss her starting tamoxifen.

## 2012-08-29 NOTE — Progress Notes (Signed)
Hematology and Oncology Follow Up Visit  [name] [id number] 1961-02-28 51 y.o. 08/29/2012 9:23 AM   DIAGNOSIS:  DCIS   PAST THERAPY:  Lumpectomy 06/21/2012 4.7 cm focus of DCIS ER +35%, PR +5% HER-2 pending  Interim History:  As above, patient has recovered fairly well. She is having some discomfort in her axilla. She has difficulty sleeping as trouble with hot flashes. She also has an anxiety related to her recent diagnosis. She has ultimately agreed to go on the B. 43 study and has signed consent for HER-2 testing. She is received her first cycle of Herceptin. She is due for another dose today. She is on radiation therapy and tolerating it well. She is due to complete radiation 09/16/2012. She is having hot flashes. These disturb her sleep.  Medications: I have reviewed the patient's current medications.  Allergies:  Allergies  Allergen Reactions  . Zithromax (Azithromycin) Nausea And Vomiting  . Erythromycin Nausea Only    Stomach cramps    Past Medical History, Surgical history, Social history, and Family History were reviewed and updated.  Review of Systems: Constitutional:  Negative for fever, chills, night sweats, anorexia, weight loss, pain. Cardiovascular: no chest pain or dyspnea on exertion Respiratory: no cough, shortness of breath, or wheezing Neurological: negative Dermatological: negative ENT: negative Skin Gastrointestinal: negative Genito-Urinary: negative Hematological and Lymphatic: negative Breast: negative Musculoskeletal: negative Remaining ROS negative.  Physical Exam:  Blood pressure 119/75, pulse 63, temperature 97.5 F (36.4 C), resp. rate 20, height 5\' 6"  (1.676 m), weight 196 lb 6.4 oz (89.086 kg).  ECOG: 0 HEENT:  Sclerae anicteric, conjunctivae pink.  Oropharynx clear.  No mucositis or candidiasis.  Nodes:  No cervical, supraclavicular, or axillary lymphadenopathy palpated.    Lungs:  Clear to auscultation bilaterally.  No crackles,  rhonchi, or wheezes.  Heart:  Regular rate and rhythm.  Abdomen:  Soft, nontender.  Positive bowel sounds.  No organomegaly or masses palpated.  Musculoskeletal:  No focal spinal tenderness to palpation.  Extremities:  Benign.  No peripheral edema or cyanosis.  Skin:  Benign.  Neuro:  Nonfocal.      Lab Results: Lab Results  Component Value Date   WBC 8.1 07/09/2012   HGB 11.6 07/09/2012   HCT 34.2* 07/09/2012   MCV 89.8 07/09/2012   PLT 300 07/09/2012     Chemistry      Component Value Date/Time   NA 137 07/09/2012 1226   NA 131* 06/19/2012 0807   K 4.3 07/09/2012 1226   K 3.7 06/19/2012 0807   CL 102 07/09/2012 1226   CL 92* 06/19/2012 0807   CO2 24 07/09/2012 1226   CO2 28 06/19/2012 0807   BUN 16.0 07/09/2012 1226   BUN 16 06/19/2012 0807   CREATININE 0.8 07/09/2012 1226   CREATININE 0.68 06/19/2012 0807      Component Value Date/Time   CALCIUM 9.8 07/09/2012 1226   CALCIUM 10.4 06/19/2012 0807   ALKPHOS 95 07/09/2012 1226   ALKPHOS 103 06/19/2012 0807   AST 28 07/09/2012 1226   AST 41* 06/19/2012 0807   ALT 19 07/09/2012 1226   ALT 24 06/19/2012 0807   BILITOT 0.50 07/09/2012 1226   BILITOT 0.4 06/19/2012 9147       Radiological Studies:  Mr Breast Bilateral W Wo Contrast  06/18/2012   *RADIOLOGY REPORT*  Clinical Data: Biopsy-proven high-grade DCIS manifesting as mammographically detected calcifications in the right upper outer quadrant.  BILATERAL BREAST MRI WITH AND WITHOUT CONTRAST  Technique:  Multiplanar, multisequence MR images of both breasts were obtained prior to and following the intravenous administration of 18ml of Multihance.  Three dimensional images were evaluated at the independent DynaCad workstation.  Comparison:  Prior mammograms  Findings: Minimal post biopsy change and clip artifact are noted in the right upper outer quadrant at the site of biopsy-proven DCIS. Adjacent to the biopsy cavity, there is clumped nodular enhancement with plateau type enhancement kinetics measuring 1.6 x  1.2 x 1.0 cm.  This corresponds to the area of biopsy-proven DCIS.  No abnormal T2-weighted hyperintensity or lymphadenopathy is seen elsewhere in either side.  No other area of abnormal enhancement, allowing for moderate background type parenchymal enhancement pattern which may decrease the sensitivity for detection of malignancy.  IMPRESSION: Solitary abnormal clumped nodular enhancement right breast upper outer quadrant with central clip artifact, corresponding to biopsy- proven DCIS.  No MRI evidence for multifocal or multicentric or contralateral malignancy.  RECOMMENDATION: BI-RADS CATEGORY 6:  Known biopsy-proven malignancy - appropriate action should be taken.  Recommendation:  Treatment plan  THREE-DIMENSIONAL MR IMAGE RENDERING ON INDEPENDENT WORKSTATION:  Three-dimensional MR images were rendered by post-processing of the original MR data on an independent workstation.  The three- dimensional MR images were interpreted, and findings were reported in the accompanying complete MRI report for this study.  Original Report Authenticated By: Harrel Lemon, M.D.   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  I spoke with the patient on 06/12/2012 at to 1320 hours.  She described mild pain at the biopsy site, controlled by acetaminophen.  She also described bruising in the right breast. She denied using from the incision site.  I informed her to contact me in the event that the pain became severe or if she began bleeding from the incision site.  The patient has been scheduled for the multidisciplinary Breast Clinic on Wednesday, August 7.  She will have breast MRI on Tuesday, August 6.  I telephoned these results to [facility], Dr. Dennie Bible nurse, on 06/13/2012 at 1030 hours.  Addended by:  Arnell Sieving, M.D.  **END ADDENDUM** SIGNED BY: Arnell Sieving, M.D.    06/11/2012    *RADIOLOGY REPORT*  Clinical Data:  Suspicious calcifications in the upper outer quadrant of the right breast.  STEREOTACTIC-GUIDED VACUUM ASSISTED BIOPSY OF THE RIGHT BREAST AND SPECIMEN RADIOGRAPH  Comparison: Diagnostic mammograms earlier same date and dating back to December, 2009.  I met with the patient and we discussed the procedure of stereotactic-guided biopsy, including benefits and alternatives. We discussed the high likelihood of a successful procedure. We discussed the risks of the procedure, including infection, bleeding, tissue injury, clip migration, and inadequate sampling. Informed, written consent was given.  Using sterile technique, 2% lidocaine, stereotactic guidance, and a 9 gauge vacuum assisted device, biopsy was performed of the calcifications in the upper outer quadrant of the right breast. Specimen radiograph was performed, showing calcifications in several of the cores.  At the conclusion of the procedure, an S-mark tissue marker clip was deployed into the biopsy cavity.  Follow-up 2-view mammogram confirmed clip placement in the appropriate position in the upper outer quadrant of the right breast in the area of calcifications.  IMPRESSION: Stereotactic-guided biopsy of calcifications in the upper outer quadrant of the right breast.  No apparent complications.  This was discussed with Dr. Marcelle Overlie by telephone on 06/11/2012 at 1530 hours.  Original Report Authenticated By: Arnell Sieving, M.D.   Mm Breast Surgical Specimen  06/21/2012  *RADIOLOGY REPORT*  Clinical Data:  carcinoma right breast 12:00 position  NEEDLE LOCALIZATION WITH MAMMOGRAPHIC GUIDANCE AND SPECIMEN RADIOGRAPH  Comparison:  Previous exams.  Patient presents for needle localization prior to lumpectomy.  I met with the patient and we discussed the procedure of needle localization including benefits and alternatives. We discussed the high likelihood of a successful procedure. We discussed the risks of  the procedure, including infection, bleeding, tissue injury, and further surgery. Informed, written consent was given.  Using mammographic guidance, sterile technique, 2% lidocaine and a 7cm modified Kopans needle, the remaining calcifications and clip were localized using a craniocaudal approach.  The films are marked for Dr. Jamey Ripa.  Specimen radiograph was performed at [facility], and confirms remaining calcifications and clip present in the tissue sample. The specimen is marked for pathology.  IMPRESSION: Needle localization right breast.  No apparent complications.  Original Report Authenticated By: Otilio Carpen, M.D.   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  I spoke with the patient on 06/12/2012 at to 1320 hours.  She described mild pain at the biopsy site, controlled by acetaminophen.  She also described bruising in the right breast. She denied using from the incision site.  I informed her to contact me in the event that the pain became severe or if she began bleeding from the incision site.  The patient has been scheduled for the multidisciplinary Breast Clinic on Wednesday, August 7.  She will have breast MRI on Tuesday, August 6.  I telephoned these results to [facility], Dr. Dennie Bible nurse, on 06/13/2012 at 1030 hours.  Addended by:  Arnell Sieving, M.D.  **END ADDENDUM** SIGNED BY: Arnell Sieving, M.D.   06/11/2012    *RADIOLOGY REPORT*  Clinical Data:  Suspicious calcifications in the upper outer quadrant of the right breast.  STEREOTACTIC-GUIDED VACUUM ASSISTED BIOPSY OF THE RIGHT BREAST AND SPECIMEN RADIOGRAPH  Comparison: Diagnostic mammograms earlier same date and dating back to December, 2009.  I met with the patient and we discussed the procedure of stereotactic-guided biopsy, including benefits and alternatives. We discussed the high likelihood of  a successful procedure. We discussed the risks of the procedure, including infection, bleeding, tissue injury, clip migration, and inadequate sampling. Informed, written consent was given.  Using sterile technique, 2% lidocaine, stereotactic guidance, and a 9 gauge vacuum assisted device, biopsy was performed of the calcifications in the upper outer quadrant of the right breast. Specimen radiograph was performed, showing calcifications in several of the cores.  At the conclusion of the procedure, an S-mark tissue marker clip was deployed into the biopsy cavity.  Follow-up 2-view mammogram confirmed clip placement in the appropriate position in the upper outer quadrant of the right breast in the area of calcifications.  IMPRESSION: Stereotactic-guided biopsy of calcifications in the upper outer quadrant of the right breast.  No apparent complications.  This was discussed with Dr. Marcelle Overlie by telephone on 06/11/2012 at 1530 hours.  Original Report Authenticated By: Arnell Sieving, M.D.   Mm Breast Wire Localization Right  06/21/2012  *RADIOLOGY REPORT*  Clinical Data:  carcinoma right breast 12:00 position  NEEDLE LOCALIZATION WITH MAMMOGRAPHIC GUIDANCE AND SPECIMEN RADIOGRAPH  Comparison:  Previous exams.  Patient presents for needle localization prior to lumpectomy.  I met with the patient and we  discussed the procedure of needle localization including benefits and alternatives. We discussed the high likelihood of a successful procedure. We discussed the risks of the procedure, including infection, bleeding, tissue injury, and further surgery. Informed, written consent was given.  Using mammographic guidance, sterile technique, 2% lidocaine and a 7cm modified Kopans needle, the remaining calcifications and clip were localized using a craniocaudal approach.  The films are marked for Dr. Jamey Ripa.  Specimen radiograph was performed at [facility], and confirms remaining calcifications and clip present in the tissue  sample. The specimen is marked for pathology.  IMPRESSION: Needle localization right breast.  No apparent complications.  Original Report Authenticated By: Otilio Carpen, M.D.   Mm Digital Diagnostic Bilat  06/11/2012   *RADIOLOGY REPORT*  Clinical Data:  Short-term interval follow-up of right breast calcifications.  Annual reevaluation, left breast.  DIGITAL DIAGNOSTIC BILATERAL MAMMOGRAM WITH CAD  Comparison:  12/01/2011, 05/26/2011, 05/09/2011, dating back to 11/09/2008.  Findings:  CC and MLO views of both breasts and spot magnification views of the right breast calcifications were obtained. Scattered fibroglandular breast tissue, unchanged. There has been increase in the number of the calcifications in the upper outer quadrant right breast since the examination 6 months ago.  The calcifications are pleomorphic, with punctate and linear forms.  The maximum in size of the cluster approximates 16 mm.  No new masses, architectural distortion, or suspicious calcifications elsewhere in either breast. Mammographic images were processed with CAD.  IMPRESSION:  1.  Interval increase in the number of pleomorphic calcifications in the upper outer quadrant of the right breast since the examination 6 months ago. 2.  No mammographic evidence of malignancy, left breast.  RECOMMENDATION: Stereotactic core needle biopsy of the calcifications was discussed with the patient, in lieu of the increasing number and increasing pleomorphism.  She has agreed to proceed, this was performed subsequently and will be reported separately.  BI-RADS CATEGORY 4:  Suspicious abnormality - biopsy should be considered.  Original Report Authenticated By: Arnell Sieving, M.D.     IMPRESSIONS AND PLAN: A 51 y.o. female with   History of DCIS status post lumpectomy ER/PR positive and HER-2 results. She will receive cycle 2 of Herceptin today. She is tolerating it well. She is having hot flashes I recommended she study to her. I've  also recommended tendency and given instructions about this. I will see her back in the third week of November to determine how she is doing and to discuss initiation of tamoxifen. Spent more than half the time coordinating care, as well as discussion of BMI and its implications.      [name] 10/17/20139:23 AM Cell [id number]

## 2012-08-30 ENCOUNTER — Ambulatory Visit
Admission: RE | Admit: 2012-08-30 | Discharge: 2012-08-30 | Disposition: A | Discharge status: Home / Self Care | Payer: BC Managed Care – PPO | Source: Ambulatory Visit | Attending: Radiation Oncology | Admitting: Radiation Oncology

## 2012-08-30 ENCOUNTER — Other Ambulatory Visit: Payer: Self-pay | Admitting: Oncology

## 2012-08-30 DIAGNOSIS — C50419 Malignant neoplasm of upper-outer quadrant of unspecified female breast: Secondary | ICD-10-CM

## 2012-09-02 ENCOUNTER — Encounter: Payer: Self-pay | Admitting: Radiation Oncology

## 2012-09-02 ENCOUNTER — Ambulatory Visit
Admission: RE | Admit: 2012-09-02 | Discharge: 2012-09-02 | Disposition: A | Discharge status: Home / Self Care | Payer: BC Managed Care – PPO | Source: Ambulatory Visit | Attending: Radiation Oncology | Admitting: Radiation Oncology

## 2012-09-02 VITALS — BP 144/60 | HR 80 | Temp 98.2°F | Resp 20 | Wt 195.0 lb

## 2012-09-02 DIAGNOSIS — C50419 Malignant neoplasm of upper-outer quadrant of unspecified female breast: Secondary | ICD-10-CM

## 2012-09-02 NOTE — Progress Notes (Signed)
Weekly Management Note:  Site: Right breast Current Dose:  4000  cGy Projected Dose: 6000  cGy  Narrative: The patient is seen today for routine under treatment assessment. CBCT/MVCT images/port films were reviewed. The chart was reviewed.   No new complaints today. She has been using "dermatherapy" sheets on protocol for her hot flashes which are improved.  Physical Examination:  Filed Vitals:   09/02/12 1635  BP: 144/60  Pulse: 80  Temp: 98.2 F (36.8 C)  Resp: 20  .  Weight: 195 lb (88.451 kg). There is mild erythema of the skin along the right breast without areas of desquamation.  Impression: Tolerating radiation therapy well.  Plan: Continue radiation therapy as planned.

## 2012-09-02 NOTE — Progress Notes (Signed)
Patient here weekly rad txs 20/3 txs right breast , slight erythema, still has hot flashes, and headaches, fatigued today moreso, had 2nd Herceprtin infusion last Thursday, patient walked around [place],  [place] [address] [place], running around a lot and today at [place] today also this morning,  Taste buds are off stated 4:40 PM

## 2012-09-03 ENCOUNTER — Ambulatory Visit
Admission: RE | Admit: 2012-09-03 | Discharge: 2012-09-03 | Disposition: A | Discharge status: Home / Self Care | Payer: BC Managed Care – PPO | Source: Ambulatory Visit | Attending: Radiation Oncology | Admitting: Radiation Oncology

## 2012-09-03 ENCOUNTER — Other Ambulatory Visit: Payer: Self-pay | Admitting: *Deleted

## 2012-09-03 DIAGNOSIS — C50919 Malignant neoplasm of unspecified site of unspecified female breast: Secondary | ICD-10-CM

## 2012-09-03 MED ORDER — PROCHLORPERAZINE MALEATE 10 MG PO TABS: 10.0000 mg | ORAL_TABLET | Freq: Four times a day (QID) | ORAL | Status: DC | PRN

## 2012-09-04 ENCOUNTER — Ambulatory Visit
Admission: RE | Admit: 2012-09-04 | Discharge: 2012-09-04 | Disposition: A | Discharge status: Home / Self Care | Payer: BC Managed Care – PPO | Source: Ambulatory Visit | Attending: Radiation Oncology | Admitting: Radiation Oncology

## 2012-09-04 ENCOUNTER — Ambulatory Visit: Payer: BC Managed Care – PPO

## 2012-09-05 ENCOUNTER — Ambulatory Visit: Payer: BC Managed Care – PPO

## 2012-09-05 ENCOUNTER — Ambulatory Visit
Admission: RE | Admit: 2012-09-05 | Discharge: 2012-09-05 | Disposition: A | Discharge status: Home / Self Care | Payer: BC Managed Care – PPO | Source: Ambulatory Visit | Attending: Radiation Oncology | Admitting: Radiation Oncology

## 2012-09-06 ENCOUNTER — Ambulatory Visit: Payer: BC Managed Care – PPO

## 2012-09-06 ENCOUNTER — Ambulatory Visit
Admission: RE | Admit: 2012-09-06 | Discharge: 2012-09-06 | Disposition: A | Discharge status: Home / Self Care | Payer: BC Managed Care – PPO | Source: Ambulatory Visit | Attending: Radiation Oncology | Admitting: Radiation Oncology

## 2012-09-09 ENCOUNTER — Ambulatory Visit: Payer: BC Managed Care – PPO

## 2012-09-09 ENCOUNTER — Ambulatory Visit
Admission: RE | Admit: 2012-09-09 | Discharge: 2012-09-09 | Disposition: A | Discharge status: Home / Self Care | Payer: BC Managed Care – PPO | Source: Ambulatory Visit | Attending: Radiation Oncology | Admitting: Radiation Oncology

## 2012-09-09 ENCOUNTER — Encounter: Payer: Self-pay | Admitting: Radiation Oncology

## 2012-09-09 ENCOUNTER — Telehealth: Payer: Self-pay | Admitting: Emergency Medicine

## 2012-09-09 VITALS — BP 146/69 | HR 64 | Temp 98.3°F | Resp 20 | Wt 195.3 lb

## 2012-09-09 DIAGNOSIS — C50419 Malignant neoplasm of upper-outer quadrant of unspecified female breast: Secondary | ICD-10-CM

## 2012-09-09 MED ORDER — RADIAPLEXRX EX GEL
Freq: Once | CUTANEOUS | Status: AC
  Administered 2012-09-09: 10:00:00 via TOPICAL

## 2012-09-09 NOTE — Progress Notes (Signed)
patient here for weekly rad txs, compleetd 25/30 right breast, erythema only,skin intact, occasional sharp pain throughout r breast at times, asked for another radiaplex gel is out, will give, discussed [facility] support group will ask Lenell Antu to call for next session, also patient wants to schedule appt with Mrs. Noe Gens, will ask Md to put referral in, 10:25 AM

## 2012-09-09 NOTE — Telephone Encounter (Signed)
Patient calling complaining of left lower leg pain. She states onset this AM and has gotten progressively worse throughout the day. Patient denies any injury to her knowledge. Patient denies any obvious swelling in the left lower extremity. Discussed with Dr Donnie Coffin and advised patient to see his nurse tomorrow morning when she is here for her radiation treatment.

## 2012-09-09 NOTE — Progress Notes (Addendum)
Weekly Management Note:  Site: Right Current Dose:  5000  cGy Projected Dose: 6000  cGy  Narrative: The patient is seen today for routine under treatment assessment. CBCT/MVCT images/port films were reviewed. The chart was reviewed.   She still has a fair amount of anxiety. 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  Physical Examination:  Filed Vitals:   09/09/12 1022  BP: 146/69  Pulse: 64  Temp: 98.3 F (36.8 C)  Resp: 20  .  Weight: 195 lb 4.8 oz (88.587 kg). There is diffuse erythema along the right breast with no areas of desquamation.  Impression: Tolerating radiation therapy well with the expected degree of radiation dermatitis. She is now willing to talk to Dr. Noe Gens about her anxiety.  Plan: Continue radiation therapy as planned. Orders written for her to see Dr. Noe Gens.

## 2012-09-10 ENCOUNTER — Ambulatory Visit: Payer: BC Managed Care – PPO

## 2012-09-10 ENCOUNTER — Ambulatory Visit
Admission: RE | Admit: 2012-09-10 | Discharge: 2012-09-10 | Disposition: A | Discharge status: Home / Self Care | Payer: BC Managed Care – PPO | Source: Ambulatory Visit | Attending: Radiation Oncology | Admitting: Radiation Oncology

## 2012-09-10 ENCOUNTER — Encounter (INDEPENDENT_AMBULATORY_CARE_PROVIDER_SITE_OTHER): Payer: Self-pay | Admitting: Surgery

## 2012-09-11 ENCOUNTER — Ambulatory Visit: Payer: BC Managed Care – PPO

## 2012-09-11 ENCOUNTER — Ambulatory Visit
Admission: RE | Admit: 2012-09-11 | Discharge: 2012-09-11 | Disposition: A | Discharge status: Home / Self Care | Payer: BC Managed Care – PPO | Source: Ambulatory Visit | Attending: Radiation Oncology | Admitting: Radiation Oncology

## 2012-09-11 ENCOUNTER — Ambulatory Visit (INDEPENDENT_AMBULATORY_CARE_PROVIDER_SITE_OTHER): Payer: BC Managed Care – PPO | Admitting: Psychiatry

## 2012-09-11 DIAGNOSIS — F0630 Mood disorder due to known physiological condition, unspecified: Secondary | ICD-10-CM

## 2012-09-12 ENCOUNTER — Ambulatory Visit: Payer: BC Managed Care – PPO

## 2012-09-12 ENCOUNTER — Ambulatory Visit
Admission: RE | Admit: 2012-09-12 | Discharge: 2012-09-12 | Disposition: A | Discharge status: Home / Self Care | Payer: BC Managed Care – PPO | Source: Ambulatory Visit | Attending: Radiation Oncology | Admitting: Radiation Oncology

## 2012-09-12 ENCOUNTER — Ambulatory Visit: Payer: BC Managed Care – PPO | Admitting: Psychiatry

## 2012-09-13 ENCOUNTER — Ambulatory Visit: Payer: BC Managed Care – PPO

## 2012-09-13 ENCOUNTER — Ambulatory Visit
Admission: RE | Admit: 2012-09-13 | Discharge: 2012-09-13 | Disposition: A | Discharge status: Home / Self Care | Payer: BC Managed Care – PPO | Source: Ambulatory Visit | Attending: Radiation Oncology | Admitting: Radiation Oncology

## 2012-09-16 ENCOUNTER — Ambulatory Visit: Payer: BC Managed Care – PPO

## 2012-09-16 ENCOUNTER — Encounter: Payer: Self-pay | Admitting: Radiation Oncology

## 2012-09-16 ENCOUNTER — Ambulatory Visit
Admission: RE | Admit: 2012-09-16 | Discharge: 2012-09-16 | Disposition: A | Discharge status: Home / Self Care | Payer: BC Managed Care – PPO | Source: Ambulatory Visit | Attending: Radiation Oncology | Admitting: Radiation Oncology

## 2012-09-16 VITALS — BP 123/65 | HR 57 | Temp 98.1°F | Resp 20 | Wt 196.6 lb

## 2012-09-16 DIAGNOSIS — C50419 Malignant neoplasm of upper-outer quadrant of unspecified female breast: Secondary | ICD-10-CM

## 2012-09-16 NOTE — Progress Notes (Signed)
Weekly Management Note:  Site: Right breast Current Dose:  6000  cGy Projected Dose: 6000  cGy  Narrative: The patient is seen today for routine under treatment assessment. CBCT/MVCT images/port films were reviewed. The chart was reviewed.   No new complaints today. She does have occasional shooting pains within her right breast. She met with Dr. Noe Gens and was given good advice. She may be counseling through her work  Physical Examination:  Filed Vitals:   09/16/12 1035  BP: 123/65  Pulse: 57  Temp: 98.1 F (36.7 C)  Resp: 20  .  Weight: 196 lb 9.6 oz (89.177 kg). There is moderate erythema the skin along the right breast with no areas of desquamation.  Impression: Satisfactory progress  Plan: Radiation therapy completed. One-month followup visit on December 3.

## 2012-09-16 NOTE — Progress Notes (Signed)
Patient here for final rad tx 30/30completed, mild erythema on right breast, skin intact, 1 month f/u appt card given , pain in right breast last few days stated, says goes all the way up to her shoulder, , spouse with patient, patient exercising her arm, the "sheets are helping her to sleep better", just hard to go to sleep, patient did see Enzo Bi last week, patient does have a insurance at work that could provide free counseling, if she feels she needs it in the futuure 10:35 AM'

## 2012-09-17 ENCOUNTER — Encounter: Payer: Self-pay | Admitting: Radiation Oncology

## 2012-09-17 NOTE — Progress Notes (Signed)
Simulation verification note: The patient underwent similar to verification for her wedge pair right breast boost on 09/10/2012. Her isocenter was in good position and the multileaf collimators contoured the treatment volume appropriately.

## 2012-09-17 NOTE — Progress Notes (Addendum)
[facility] Radiation Oncology End of Treatment Note  Name:[name]  Date: 09/17/2012 AVW:[id number] DOB:08/26/1961   Status:outpatient    CC:  Dr. Gwendlyn Deutscher, Dr. Richarda Overlie, Dr. Pierce Crane  REFERRING PHYSICIAN:  Dr. 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 6 of the photons wedged pair boost to the right breast.                 NARRATIVE:   [name] tolerated treatment well with only focal moist desquamation along the inframammary fold by completion of therapy.   She had a great deal of anxiety during her course of therapy, presuming from her work. She was seen by Dr. Merry Proud in consultation and will seek followup counseling to her employment.                      PLAN: Routine followup in one month. Patient instructed to call if questions or worsening complaints in interim.

## 2012-09-18 ENCOUNTER — Encounter (INDEPENDENT_AMBULATORY_CARE_PROVIDER_SITE_OTHER): Payer: Self-pay | Admitting: Surgery

## 2012-09-30 ENCOUNTER — Other Ambulatory Visit: Payer: Self-pay | Admitting: *Deleted

## 2012-09-30 DIAGNOSIS — C50419 Malignant neoplasm of upper-outer quadrant of unspecified female breast: Secondary | ICD-10-CM

## 2012-10-01 ENCOUNTER — Telehealth: Payer: Self-pay | Admitting: *Deleted

## 2012-10-01 ENCOUNTER — Ambulatory Visit (INDEPENDENT_AMBULATORY_CARE_PROVIDER_SITE_OTHER): Payer: BC Managed Care – PPO | Admitting: Surgery

## 2012-10-01 ENCOUNTER — Encounter (INDEPENDENT_AMBULATORY_CARE_PROVIDER_SITE_OTHER): Payer: Self-pay | Admitting: Surgery

## 2012-10-01 VITALS — BP 104/80 | HR 80 | Temp 97.0°F | Resp 18 | Ht 66.0 in | Wt 194.0 lb

## 2012-10-01 DIAGNOSIS — Z853 Personal history of malignant neoplasm of breast: Secondary | ICD-10-CM

## 2012-10-01 NOTE — Telephone Encounter (Signed)
per the md going to be on vac moved patient appointment to 10-21-2012 starting at 2:30pm  Left voice message to inform the patient of the new date and time

## 2012-10-01 NOTE — Patient Instructions (Signed)
See in August, after you have your mammogram

## 2012-10-01 NOTE — Progress Notes (Signed)
NAME: [name]                                            DOB: 11-Jun-1961 DATE: 10/01/2012                                                  MRN: [id number]  CC: Post op   HPI: This patient comes in for post op follow-up.Sheunderwent right NL lumpectomy  on 06/21/12. She finished radiation about two weeks ago. Tender at lumpectomy site is only current issue. 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  Discussed lymphedema with her - no nodes removed so no increased risk

## 2012-10-01 NOTE — Telephone Encounter (Signed)
Patient confirmed over the phone the new date and time 

## 2012-10-03 ENCOUNTER — Other Ambulatory Visit: Payer: BC Managed Care – PPO | Admitting: Lab

## 2012-10-03 ENCOUNTER — Ambulatory Visit: Payer: BC Managed Care – PPO | Admitting: Oncology

## 2012-10-15 ENCOUNTER — Ambulatory Visit
Admission: RE | Admit: 2012-10-15 | Discharge: 2012-10-15 | Disposition: A | Discharge status: Home / Self Care | Payer: BC Managed Care – PPO | Source: Ambulatory Visit | Attending: Radiation Oncology | Admitting: Radiation Oncology

## 2012-10-15 ENCOUNTER — Encounter: Payer: Self-pay | Admitting: Radiation Oncology

## 2012-10-15 ENCOUNTER — Encounter: Payer: Self-pay | Admitting: *Deleted

## 2012-10-15 VITALS — BP 122/75 | HR 65 | Temp 98.1°F | Resp 20 | Wt 198.4 lb

## 2012-10-15 DIAGNOSIS — C50419 Malignant neoplasm of upper-outer quadrant of unspecified female breast: Secondary | ICD-10-CM

## 2012-10-15 NOTE — Progress Notes (Signed)
Followup note:  The patient returns today approximately 1 month following completion of radiation therapy following conservative surgery in the management of her stage 0 (Tis N0 M0) DCIS of the right breast. She continues to have moderate fatigue, but this is slowly improving. She is seen by the protocol nurse today for followup of her participation on the B. 43 protocol. She saw Dr. Jamey Ripa a few weeks ago and he will see her back after her next mammogram later this spring. She tells me she will see Dr.Rubin on December 9.  Physical examination: Alert and oriented. She looks well. Wt Readings from Last 3 Encounters:  10/15/12 198 lb 6.4 oz (89.994 kg)  10/01/12 194 lb (87.998 kg)  09/16/12 196 lb 9.6 oz (89.177 kg)   Temp Readings from Last 3 Encounters:  10/15/12 98.1 F (36.7 C) Oral  10/01/12 97 F (36.1 C) Temporal  09/16/12 98.1 F (36.7 C) Oral   BP Readings from Last 3 Encounters:  10/15/12 122/75  10/01/12 104/80  09/16/12 123/65   Pulse Readings from Last 3 Encounters:  10/15/12 65  10/01/12 80  09/16/12 57   Head and neck examination: Grossly unremarkable. Nodes: Without palpable cervical, supraclavicular, or axillary lymphadenopathy. Chest: Lungs clear. Breasts: There is residual erythema of the skin along the right breast with slight edema along the nipple areolar complex. Minimal breast thickening. No masses are appreciated. Left breast without masses or lesions. Extremities: Without edema.  Impression: Satisfactory progress. She is still recovering from her fatigue. She is in better spirits,  having decompressed from her job. She would like to return to work on 11/14/2012.  Plan: I wrote a note for her to return to work on January 2, to work half time for the first week and then full-time beginning the week of 11/21/2012. I told her that she should have followup mammography abide this may, and this will serve as a baseline on the right and a screening mammogram on the  left. I have not scheduled the patient for a formal followup visit, knowing that she'll be followed by Dr. Pierce Crane and Dr. Cicero Duck.

## 2012-10-15 NOTE — Progress Notes (Signed)
10/15/12 at 10:50am - NSABP B-43 (30 days after completion of RT)- The pt was into the cancer center today for her 1 month follow up after completing her radiation on 09/16/12.  The radiation oncologist was not able to see the pt on day 30, so the pt was seen 1 day early today.  The pt reports that she is feeling "almost normal" and is anxious about returning to work.  She reports ongoing "moderate" intermittent headaches (grade 2) and "moderate" fatigue (grade 2).  The pt was seen and examined today by Dr. Dayton Scrape.  He felt the pt needed to continue to recover from her fatigue this month.  He stated the pt had healed very well from her radiation.  At the end of her radiation, the pt had some moist desquamation was noted along with moderate erythema. (radiation dermatitis- grade 2)  She also reported some "sharp" breast pain- grade 2.  The pt also had the following AE's during her treatment:  moderate anxiety (grade 2-took Xanax and saw Dr. Noe Gens) and hot flashes (grade 2-) and insomnia (grade 2 which is related to anxiety and hot flashes).   Dr. Dayton Scrape advised the pt to have her next mammogram in May 2014.   He felt the pt will be ready to return to work on 11/14/12.  The pt will be seen next week by Dr. Donnie Coffin to discuss her hormonal therapy.  The research nurse explained to the pt that the AE reporting/documentation period is required through day 30 which is tomorrow.  The research nurse agreed to see the pt next week at her next visit to assess for any new AE's that occurred after her 10/15/12 office visit  The pt verbalized understanding.    10/21/12 at 3:00pm - The pt was into the cancer center this afternoon for a follow up visit with Dr. Donnie Coffin to discuss her hormonal therapy initiation.  The pt denies any new, unreported adverse events since her last week's appointment on 10/15/12.  The pt states she is eager to get back work in January.  The pt was informed that her next follow up visit is due in March 2014.

## 2012-10-15 NOTE — Progress Notes (Signed)
Patient here follow up  S/p radiation treatment to right breast :08/06/12-09/16/12 Breast well healed, alert,oriented x3, occasional twinges in right breast,  meds updated, pt requesting work release for January 2,2014, still has some fatigue 10:10 AM

## 2012-10-21 ENCOUNTER — Other Ambulatory Visit (HOSPITAL_BASED_OUTPATIENT_CLINIC_OR_DEPARTMENT_OTHER): Payer: BC Managed Care – PPO | Admitting: Lab

## 2012-10-21 ENCOUNTER — Ambulatory Visit (HOSPITAL_BASED_OUTPATIENT_CLINIC_OR_DEPARTMENT_OTHER): Payer: BC Managed Care – PPO | Admitting: Oncology

## 2012-10-21 VITALS — BP 131/85 | HR 64 | Temp 98.2°F | Resp 20 | Ht 66.0 in | Wt 196.4 lb

## 2012-10-21 DIAGNOSIS — C50419 Malignant neoplasm of upper-outer quadrant of unspecified female breast: Secondary | ICD-10-CM

## 2012-10-21 DIAGNOSIS — Z170 Estrogen receptor positive status [ER+]: Secondary | ICD-10-CM

## 2012-10-21 DIAGNOSIS — D0590 Unspecified type of carcinoma in situ of unspecified breast: Secondary | ICD-10-CM

## 2012-10-21 LAB — CBC WITH DIFFERENTIAL/PLATELET
Basophils Absolute: 0 10*3/uL (ref 0.0–0.1)
Eosinophils Absolute: 0.1 10*3/uL (ref 0.0–0.5)
HCT: 40.2 % (ref 34.8–46.6)
HGB: 13.7 g/dL (ref 11.6–15.9)
LYMPH%: 22.8 % (ref 14.0–49.7)
MCHC: 34 g/dL (ref 31.5–36.0)
MONO#: 0.6 10*3/uL (ref 0.1–0.9)
NEUT%: 67.5 % (ref 38.4–76.8)
Platelets: 299 10*3/uL (ref 145–400)
WBC: 8.4 10*3/uL (ref 3.9–10.3)
lymph#: 1.9 10*3/uL (ref 0.9–3.3)

## 2012-10-21 LAB — COMPREHENSIVE METABOLIC PANEL (CC13)
Albumin: 4 g/dL (ref 3.5–5.0)
Alkaline Phosphatase: 147 U/L (ref 40–150)
BUN: 17 mg/dL (ref 7.0–26.0)
Glucose: 105 mg/dl — ABNORMAL HIGH (ref 70–99)
Potassium: 3.7 mEq/L (ref 3.5–5.1)

## 2012-10-21 LAB — LACTATE DEHYDROGENASE (CC13): LDH: 192 U/L (ref 125–245)

## 2012-10-21 MED ORDER — TAMOXIFEN CITRATE 20 MG PO TABS: 20.0000 mg | ORAL_TABLET | Freq: Every day | ORAL | Status: DC

## 2012-10-21 MED ORDER — VENLAFAXINE HCL 37.5 MG PO TABS: 37.5000 mg | ORAL_TABLET | Freq: Two times a day (BID) | ORAL | Status: DC

## 2012-10-21 NOTE — Progress Notes (Signed)
Hematology and Oncology Follow Up Visit  [name] [id number] September 06, 1961 51 y.o. 10/21/2012 4:07 PM   DIAGNOSIS:  DCIS   PAST THERAPY:  Lumpectomy 06/21/2012 4.7 cm focus of DCIS ER +35%, PR +5% HER-2 pending  Interim History. Sh e completed  radiation 09/16/2012. She is having hot flashes. These disturb her sleep She completed the sleep study and finds the special sheet to be of some benefit.she did enroll in B43 and tolerated herceptin well.  Medications: I have reviewed the patient's current medications.  Allergies:  Allergies  Allergen Reactions  . Zithromax (Azithromycin) Nausea And Vomiting  . Erythromycin Nausea Only    Stomach cramps    Past Medical History, Surgical history, Social history, and Family History were reviewed and updated.  Review of Systems: Constitutional:  Negative for fever, chills, night sweats, anorexia, weight loss, pain. Cardiovascular: no chest pain or dyspnea on exertion Respiratory: no cough, shortness of breath, or wheezing Neurological: negative Dermatological: negative ENT: negative Skin Gastrointestinal: negative Genito-Urinary: negative Hematological and Lymphatic: negative Breast: negative Musculoskeletal: negative Remaining ROS negative.  Physical Exam:  Blood pressure 131/85, pulse 64, temperature 98.2 F (36.8 C), temperature source Oral, resp. rate 20, height 5\' 6"  (1.676 m), weight 196 lb 6.4 oz (89.086 kg).  ECOG: 0 HEENT:  Sclerae anicteric, conjunctivae pink.  Oropharynx clear.  No mucositis or candidiasis.  Nodes:  No cervical, supraclavicular, or axillary lymphadenopathy palpated.    Lungs:  Clear to auscultation bilaterally.  No crackles, rhonchi, or wheezes.  Heart:  Regular rate and rhythm.  Abdomen:  Soft, nontender.  Positive bowel sounds.  No organomegaly or masses palpated.  Musculoskeletal:  No focal spinal tenderness to palpation.  Extremities:  Benign.  No peripheral edema or cyanosis.  Skin:  Benign.   Neuro:  Nonfocal.      Lab Results: Lab Results  Component Value Date   WBC 8.4 10/21/2012   HGB 13.7 10/21/2012   HCT 40.2 10/21/2012   MCV 90.2 10/21/2012   PLT 299 10/21/2012     Chemistry      Component Value Date/Time   NA 137 10/21/2012 1426   NA 131* 06/19/2012 0807   K 3.7 10/21/2012 1426   K 3.7 06/19/2012 0807   CL 98 10/21/2012 1426   CL 92* 06/19/2012 0807   CO2 25 10/21/2012 1426   CO2 28 06/19/2012 0807   BUN 17.0 10/21/2012 1426   BUN 16 06/19/2012 0807   CREATININE 0.8 10/21/2012 1426   CREATININE 0.68 06/19/2012 0807      Component Value Date/Time   CALCIUM 10.1 10/21/2012 1426   CALCIUM 10.4 06/19/2012 0807   ALKPHOS 147 10/21/2012 1426   ALKPHOS 103 06/19/2012 0807   AST 41* 10/21/2012 1426   AST 41* 06/19/2012 0807   ALT 32 10/21/2012 1426   ALT 24 06/19/2012 0807   BILITOT 0.47 10/21/2012 1426   BILITOT 0.4 06/19/2012 4098       Radiological Studies:  Mr Breast Bilateral W Wo Contrast  06/18/2012   *RADIOLOGY REPORT*  Clinical Data: Biopsy-proven high-grade DCIS manifesting as mammographically detected calcifications in the right upper outer quadrant.  BILATERAL BREAST MRI WITH AND WITHOUT CONTRAST  Technique: Multiplanar, multisequence MR images of both breasts were obtained prior to and following the intravenous administration of 18ml of Multihance.  Three dimensional images were evaluated at the independent DynaCad workstation.  Comparison:  Prior mammograms  Findings: Minimal post biopsy change and clip artifact are noted in the right upper outer  quadrant at the site of biopsy-proven DCIS. Adjacent to the biopsy cavity, there is clumped nodular enhancement with plateau type enhancement kinetics measuring 1.6 x 1.2 x 1.0 cm.  This corresponds to the area of biopsy-proven DCIS.  No abnormal T2-weighted hyperintensity or lymphadenopathy is seen elsewhere in either side.  No other area of abnormal enhancement, allowing for moderate background type parenchymal enhancement pattern which  may decrease the sensitivity for detection of malignancy.  IMPRESSION: Solitary abnormal clumped nodular enhancement right breast upper outer quadrant with central clip artifact, corresponding to biopsy- proven DCIS.  No MRI evidence for multifocal or multicentric or contralateral malignancy.  RECOMMENDATION: BI-RADS CATEGORY 6:  Known biopsy-proven malignancy - appropriate action should be taken.  Recommendation:  Treatment plan  THREE-DIMENSIONAL MR IMAGE RENDERING ON INDEPENDENT WORKSTATION:  Three-dimensional MR images were rendered by post-processing of the original MR data on an independent workstation.  The three- dimensional MR images were interpreted, and findings were reported in the accompanying complete MRI report for this study.  Original Report Authenticated By: Harrel Lemon, M.D.   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  I spoke with the patient on 06/12/2012 at to 1320 hours.  She described mild pain at the biopsy site, controlled by acetaminophen.  She also described bruising in the right breast. She denied using from the incision site.  I informed her to contact me in the event that the pain became severe or if she began bleeding from the incision site.  The patient has been scheduled for the multidisciplinary Breast Clinic on Wednesday, August 7.  She will have breast MRI on Tuesday, August 6.  I telephoned these results to [facility], Dr. Dennie Bible nurse, on 06/13/2012 at 1030 hours.  Addended by:  Arnell Sieving, M.D.  **END ADDENDUM** SIGNED BY: Arnell Sieving, M.D.   06/11/2012    *RADIOLOGY REPORT*  Clinical Data:  Suspicious calcifications in the upper outer quadrant of the right breast.  STEREOTACTIC-GUIDED VACUUM ASSISTED BIOPSY OF THE RIGHT BREAST AND SPECIMEN RADIOGRAPH  Comparison: Diagnostic mammograms earlier same date and dating back  to December, 2009.  I met with the patient and we discussed the procedure of stereotactic-guided biopsy, including benefits and alternatives. We discussed the high likelihood of a successful procedure. We discussed the risks of the procedure, including infection, bleeding, tissue injury, clip migration, and inadequate sampling. Informed, written consent was given.  Using sterile technique, 2% lidocaine, stereotactic guidance, and a 9 gauge vacuum assisted device, biopsy was performed of the calcifications in the upper outer quadrant of the right breast. Specimen radiograph was performed, showing calcifications in several of the cores.  At the conclusion of the procedure, an S-mark tissue marker clip was deployed into the biopsy cavity.  Follow-up 2-view mammogram confirmed clip placement in the appropriate position in the upper outer quadrant of the right breast in the area of calcifications.  IMPRESSION: Stereotactic-guided biopsy of calcifications in the upper outer quadrant of the right breast.  No apparent complications.  This was discussed with Dr. Marcelle Overlie by telephone on 06/11/2012 at 1530 hours.  Original Report Authenticated By: Arnell Sieving, M.D.   Mm Breast Surgical Specimen  06/21/2012  *RADIOLOGY REPORT*  Clinical Data:  carcinoma right breast 12:00 position  NEEDLE LOCALIZATION WITH MAMMOGRAPHIC GUIDANCE AND SPECIMEN RADIOGRAPH  Comparison:  Previous exams.  Patient presents for needle localization prior to lumpectomy.  I met with the patient and we discussed the procedure of needle localization including benefits and alternatives. We discussed the high likelihood of a successful procedure. We discussed the risks of the procedure, including infection, bleeding, tissue injury, and further surgery. Informed, written consent was given.  Using mammographic guidance, sterile technique, 2% lidocaine and a 7cm modified Kopans needle, the remaining calcifications and clip were localized using a  craniocaudal approach.  The films are marked for Dr. Jamey Ripa.  Specimen radiograph was performed at [facility], and confirms remaining calcifications and clip present in the tissue sample. The specimen is marked for pathology.  IMPRESSION: Needle localization right breast.  No apparent complications.  Original Report Authenticated By: Otilio Carpen, M.D.   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  I spoke with the patient on 06/12/2012 at to 1320 hours.  She described mild pain at the biopsy site, controlled by acetaminophen.  She also described bruising in the right breast. She denied using from the incision site.  I informed her to contact me in the event that the pain became severe or if she began bleeding from the incision site.  The patient has been scheduled for the multidisciplinary Breast Clinic on Wednesday, August 7.  She will have breast MRI on Tuesday, August 6.  I telephoned these results to [facility], Dr. Dennie Bible nurse, on 06/13/2012 at 1030 hours.  Addended by:  Arnell Sieving, M.D.  **END ADDENDUM** SIGNED BY: Arnell Sieving, M.D.   06/11/2012    *RADIOLOGY REPORT*  Clinical Data:  Suspicious calcifications in the upper outer quadrant of the right breast.  STEREOTACTIC-GUIDED VACUUM ASSISTED BIOPSY OF THE RIGHT BREAST AND SPECIMEN RADIOGRAPH  Comparison: Diagnostic mammograms earlier same date and dating back to December, 2009.  I met with the patient and we discussed the procedure of stereotactic-guided biopsy, including benefits and alternatives. We discussed the high likelihood of a successful procedure. We discussed the risks of the procedure, including infection, bleeding, tissue injury, clip migration, and inadequate sampling. Informed, written consent was given.  Using sterile technique, 2% lidocaine, stereotactic guidance, and a 9 gauge vacuum  assisted device, biopsy was performed of the calcifications in the upper outer quadrant of the right breast. Specimen radiograph was performed, showing calcifications in several of the cores.  At the conclusion of the procedure, an S-mark tissue marker clip was deployed into the biopsy cavity.  Follow-up 2-view mammogram confirmed clip placement in the appropriate position in the upper outer quadrant of the right breast in the area of calcifications.  IMPRESSION: Stereotactic-guided biopsy of calcifications in the upper outer quadrant of the right breast.  No apparent complications.  This was discussed with Dr. Marcelle Overlie by telephone on 06/11/2012 at 1530 hours.  Original Report Authenticated By: Arnell Sieving, M.D.   Mm Breast Wire Localization Right  06/21/2012  *RADIOLOGY REPORT*  Clinical Data:  carcinoma right breast 12:00 position  NEEDLE LOCALIZATION WITH MAMMOGRAPHIC GUIDANCE AND SPECIMEN RADIOGRAPH  Comparison:  Previous exams.  Patient presents for needle localization prior to lumpectomy.  I met with the patient and we discussed the procedure of needle localization including benefits and alternatives. We discussed the high likelihood of a successful procedure. We discussed the risks of the procedure, including infection, bleeding, tissue injury, and further surgery. Informed, written consent was given.  Using mammographic guidance, sterile technique, 2% lidocaine and a 7cm modified Kopans  needle, the remaining calcifications and clip were localized using a craniocaudal approach.  The films are marked for Dr. Jamey Ripa.  Specimen radiograph was performed at [facility], and confirms remaining calcifications and clip present in the tissue sample. The specimen is marked for pathology.  IMPRESSION: Needle localization right breast.  No apparent complications.  Original Report Authenticated By: Otilio Carpen, M.D.   Mm Digital Diagnostic Bilat  06/11/2012   *RADIOLOGY REPORT*  Clinical Data:   Short-term interval follow-up of right breast calcifications.  Annual reevaluation, left breast.  DIGITAL DIAGNOSTIC BILATERAL MAMMOGRAM WITH CAD  Comparison:  12/01/2011, 05/26/2011, 05/09/2011, dating back to 11/09/2008.  Findings:  CC and MLO views of both breasts and spot magnification views of the right breast calcifications were obtained. Scattered fibroglandular breast tissue, unchanged. There has been increase in the number of the calcifications in the upper outer quadrant right breast since the examination 6 months ago.  The calcifications are pleomorphic, with punctate and linear forms.  The maximum in size of the cluster approximates 16 mm.  No new masses, architectural distortion, or suspicious calcifications elsewhere in either breast. Mammographic images were processed with CAD.  IMPRESSION:  1.  Interval increase in the number of pleomorphic calcifications in the upper outer quadrant of the right breast since the examination 6 months ago. 2.  No mammographic evidence of malignancy, left breast.  RECOMMENDATION: Stereotactic core needle biopsy of the calcifications was discussed with the patient, in lieu of the increasing number and increasing pleomorphism.  She has agreed to proceed, this was performed subsequently and will be reported separately.  BI-RADS CATEGORY 4:  Suspicious abnormality - biopsy should be considered.  Original Report Authenticated By: Arnell Sieving, M.D.     IMPRESSIONS AND PLAN: A 51 y.o. female with   History of DCIS status post lumpectomy ER/PR positive and HER-2 results.she completed radiation and tolerated it well. I have suggested effexor and peridin c The peridin C is helping to a minor degree , but she just started it.  She will be seen in 3 months.     Keiandre Cygan 12/9/20134:07 PM Cell [phone]

## 2012-10-23 ENCOUNTER — Other Ambulatory Visit: Payer: Self-pay | Admitting: *Deleted

## 2012-10-23 DIAGNOSIS — C50419 Malignant neoplasm of upper-outer quadrant of unspecified female breast: Secondary | ICD-10-CM

## 2012-10-28 ENCOUNTER — Telehealth: Payer: Self-pay | Admitting: Oncology

## 2012-10-28 NOTE — Telephone Encounter (Signed)
lmonvm for pt re appt for 12/02/12. Schedule mailed.

## 2012-11-29 ENCOUNTER — Other Ambulatory Visit: Payer: Self-pay | Admitting: Family

## 2012-11-29 DIAGNOSIS — C50419 Malignant neoplasm of upper-outer quadrant of unspecified female breast: Secondary | ICD-10-CM

## 2012-12-02 ENCOUNTER — Other Ambulatory Visit (HOSPITAL_BASED_OUTPATIENT_CLINIC_OR_DEPARTMENT_OTHER): Payer: BC Managed Care – PPO | Admitting: Lab

## 2012-12-02 DIAGNOSIS — C50419 Malignant neoplasm of upper-outer quadrant of unspecified female breast: Secondary | ICD-10-CM

## 2012-12-02 LAB — CANCER ANTIGEN 27.29: CA 27.29: 33 U/mL (ref 0–39)

## 2012-12-02 LAB — CBC WITH DIFFERENTIAL/PLATELET
BASO%: 0.6 % (ref 0.0–2.0)
EOS%: 1.2 % (ref 0.0–7.0)
HCT: 39 % (ref 34.8–46.6)
MCH: 30 pg (ref 25.1–34.0)
MCHC: 33.7 g/dL (ref 31.5–36.0)
MONO#: 0.5 10*3/uL (ref 0.1–0.9)
NEUT%: 64.2 % (ref 38.4–76.8)
RBC: 4.39 10*6/uL (ref 3.70–5.45)
RDW: 13.1 % (ref 11.2–14.5)
WBC: 7.8 10*3/uL (ref 3.9–10.3)
lymph#: 2.2 10*3/uL (ref 0.9–3.3)

## 2012-12-02 LAB — COMPREHENSIVE METABOLIC PANEL (CC13)
ALT: 16 U/L (ref 0–55)
AST: 30 U/L (ref 5–34)
Albumin: 3.6 g/dL (ref 3.5–5.0)
Alkaline Phosphatase: 117 U/L (ref 40–150)
BUN: 19 mg/dL (ref 7.0–26.0)
Calcium: 9.9 mg/dL (ref 8.4–10.4)
Chloride: 101 mEq/L (ref 98–107)
Potassium: 3.8 mEq/L (ref 3.5–5.1)

## 2012-12-04 ENCOUNTER — Other Ambulatory Visit: Payer: Self-pay | Admitting: Obstetrics and Gynecology

## 2012-12-04 DIAGNOSIS — Z853 Personal history of malignant neoplasm of breast: Secondary | ICD-10-CM

## 2012-12-06 ENCOUNTER — Telehealth: Payer: Self-pay | Admitting: *Deleted

## 2012-12-06 NOTE — Telephone Encounter (Signed)
Confirmed 01/17/13 appt w/ pt.  Pt requested Dr. Newt Lukes.  Mailed letter & calendar to pt.

## 2012-12-09 ENCOUNTER — Encounter: Payer: Self-pay | Admitting: Oncology

## 2013-01-15 ENCOUNTER — Other Ambulatory Visit: Payer: Self-pay | Admitting: Medical Oncology

## 2013-01-15 ENCOUNTER — Telehealth: Payer: Self-pay | Admitting: Medical Oncology

## 2013-01-15 NOTE — Telephone Encounter (Signed)
Patient [name] cancelling appt sched with Warner Mccreedy, NP, states since her kids are in school now she cannot make this appt and has tried to cancel it several times. Patient requesting to reschedule appt with Dr Welton Flakes. Mssg forwarded to MD/NP

## 2013-01-15 NOTE — Telephone Encounter (Signed)
Ok to see Dr. Welton Flakes with next available

## 2013-01-17 ENCOUNTER — Ambulatory Visit: Payer: BC Managed Care – PPO | Admitting: Gynecologic Oncology

## 2013-01-17 ENCOUNTER — Telehealth: Payer: Self-pay | Admitting: Oncology

## 2013-01-17 NOTE — Telephone Encounter (Signed)
lmonvm advising the pt of her appt for 02/04/2013@9 :45am

## 2013-02-04 ENCOUNTER — Encounter: Payer: Self-pay | Admitting: Gynecologic Oncology

## 2013-02-04 ENCOUNTER — Telehealth: Payer: Self-pay | Admitting: Oncology

## 2013-02-04 ENCOUNTER — Other Ambulatory Visit: Payer: BC Managed Care – PPO | Admitting: Lab

## 2013-02-04 ENCOUNTER — Ambulatory Visit (HOSPITAL_BASED_OUTPATIENT_CLINIC_OR_DEPARTMENT_OTHER): Payer: BC Managed Care – PPO | Admitting: Gynecologic Oncology

## 2013-02-04 VITALS — BP 91/52 | HR 62 | Temp 97.3°F | Resp 20 | Ht 66.0 in | Wt 193.1 lb

## 2013-02-04 DIAGNOSIS — R031 Nonspecific low blood-pressure reading: Secondary | ICD-10-CM

## 2013-02-04 DIAGNOSIS — Z170 Estrogen receptor positive status [ER+]: Secondary | ICD-10-CM

## 2013-02-04 DIAGNOSIS — D0590 Unspecified type of carcinoma in situ of unspecified breast: Secondary | ICD-10-CM

## 2013-02-04 DIAGNOSIS — N959 Unspecified menopausal and perimenopausal disorder: Secondary | ICD-10-CM

## 2013-02-04 DIAGNOSIS — C50911 Malignant neoplasm of unspecified site of right female breast: Secondary | ICD-10-CM

## 2013-02-04 MED ORDER — GABAPENTIN 100 MG PO CAPS: 100.0000 mg | ORAL_CAPSULE | Freq: Two times a day (BID) | ORAL | Status: DC

## 2013-02-04 NOTE — Progress Notes (Signed)
OFFICE PROGRESS NOTE  CC:   PCP: Dr. Dewain Penning   GYN: Dr. Marcelle Overlie   SU:  Dr. Jamey Ripa   GI:  Dr. Madilyn Fireman  DIAGNOSIS:  [name] is a 52 year old [place] woman, who underwent annual screening mammography.  On 05/26/2011, she had a screening study which was abnormal and she was referred for diagnostic study of the right breast, which showed calcifications in the right breast. A follow up mammogram in January 2013 was also felt to be benign. A subsequent mammogram on 06/11/2012 showed an increase in the number of pleomorphic calcifications upper-outer quadrant right breast. Biopsy was recommended. 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  MRI of the breasts on 06/18/2012 showed an area of nodular enhancement in the upper-outer quadrant measuring 1.6 x 1.2 x 1 cm. No other abnormalities were seen.    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  She was enrolled in the B 43 protocol and underwent two cycles of Herceptin on 08/08/12 and 08/29/12.  She completed radiation therapy under the care of Dr. Dayton Scrape from 08/06/12 to 09/16/12.  She started Tamoxifen on 10/24/12 and has been on it since.   CURRENT THERAPY:  Tamoxifen 20 mg daily since December 2013.  INTERVAL HISTORY: [name] 52 y.o. female returns for continued follow up.  She has been tolerating Tamoxifen well since December 2013.  She reports moderate hot flashes with some relief with Effexor.  She feels that after using Effexor for some time, that the medication begins to not work as well.  She reports insomnia related to her hot flashes.  She denies vaginal discharge or bleeding.  She states that her primary care physician, Dr. Yehuda Cross, has recently adjusted her blood pressure medications because her blood pressure was elevated.  Now, her blood pressure has been  running low, 91/52 today.  She reports "passing out last week."  She had gotten up to go the bathroom and the next thing she knew she woke up on the floor.  She denies injury but states that she had gotten out of bed quickly when going to the bathroom.  She reports intermittent soreness around the right breast and axilla.  She states that she is right handed and has to use to right arm frequently at work.  No other concerns voiced.  MEDICAL HISTORY: Past Medical History  Diagnosis Date  . Hypertension   . Palpitations   . Ectopic pregnancy     x1  . Obesity   . Hyperlipidemia   . Fatigue   . Diarrhea   . Insomnia   . Lower extremity edema     Resolved  . Hives     Etiology questionable  . Fatty liver     History of Ftty Liver  . Breast cancer 06/11/12    biopsy, ER/PR +  . Diabetes mellitus   . Contact lens/glasses fitting   . Snores     ALLERGIES:  is allergic to zithromax and erythromycin.  MEDICATIONS:  Current Outpatient Prescriptions  Medication Sig Dispense Refill  . aspirin 81 MG tablet Take 81 mg by mouth daily.        . Blood Glucose Monitoring Suppl (FREESTYLE FREEDOM LITE) W/DEVICE KIT       . Cetirizine HCl (ZYRTEC PO) Take by mouth daily.        Marland Kitchen  cycloSPORINE (RESTASIS) 0.05 % ophthalmic emulsion Place 1 drop into both eyes 2 (two) times daily.      Marland Kitchen EXFORGE HCT 5-160-12.5 MG TABS       . fluticasone (FLONASE) 50 MCG/ACT nasal spray Place 2 sprays into the nose daily.      Marland Kitchen FREESTYLE LITE test strip       . lactobacillus acidophilus (BACID) TABS Take 2 tablets by mouth 3 (three) times daily.      . Lancets (FREESTYLE) lancets       . metoprolol (TOPROL-XL) 50 MG 24 hr tablet TAKE 1 TABLET BY MOUTH TWICE DAILY  60 tablet  0  . Multiple Vitamin (MULTIVITAMIN) tablet Take 1 tablet by mouth daily.        . ranitidine (ZANTAC) 300 MG tablet       . tamoxifen (NOLVADEX) 20 MG tablet Take 1 tablet (20 mg total) by mouth daily.  30 tablet  6  .  valsartan-hydrochlorothiazide (DIOVAN-HCT) 320-12.5 MG per tablet       . venlafaxine XR (EFFEXOR-XR) 150 MG 24 hr capsule Take 150 mg by mouth daily.       No current facility-administered medications for this visit.    SURGICAL HISTORY:  Past Surgical History  Procedure Laterality Date  . Tubal ligation      At age 71  . Ectopic pregnancy surgery  2003    x 2  . Dilation and curettage of uterus    . Tubal ligation      reversal  . Breast surgery  06/21/12    Rt br lumpectomy  . Abdominal hysterectomy  2008    lt ovary out, endometriosis, fibroids    REVIEW OF SYSTEMS:  Constitutional: Feels fatigued related to recent low blood pressures.  Cardiovascular: No chest pain, shortness of breath, or edema.  Pulmonary: No cough or wheeze.  Gastrointestinal: No nausea, vomiting, or diarrhea. No bright red blood per rectum or change in bowel movement.  Genitourinary: No frequency, urgency, or dysuria. No vaginal bleeding or discharge.  Musculoskeletal: No myalgia or joint pain. Neurologic: No weakness, numbness, or change in gait.  Psychology: No depression, anxiety, or insomnia.  HEALTH MAINTENANCE: Mammogram:  06/11/12 Colonoscopy: 04/2012 Pap Smear:  July 2013 Eye Exam: Yearly Lipid Panel: 04/2011 per Epic  PHYSICAL EXAMINATION: Blood pressure 91/52, pulse 62, temperature 97.3 F (36.3 C), temperature source Oral, resp. rate 20, height 5\' 6"  (1.676 m), weight 193 lb 1.6 oz (87.59 kg). Body mass index is 31.18 kg/(m^2). ECOG PERFORMANCE STATUS: 1 - Symptomatic but completely ambulatory  General: Well developed, well nourished female in no acute distress. Alert and oriented x 3.  Head/ Neck: Oropharynx clear.  Sclerae anicteric.  Supple without any enlargements.  Lymph node survey: No cervical, supraclavicular, or axillary adenopathy  Cardiovascular: Regular rate and rhythm. S1 and S2 normal.  Lungs: Clear to auscultation bilaterally. No wheezes/crackles/rhonchi noted.  Skin:  No rashes or lesions present. Back: No CVA tenderness.  Abdomen: Abdomen soft, non-tender and obese. Active bowel sounds in all quadrants. No evidence of a fluid wave or abdominal masses.  Breasts: Inspection negative with no nodularity, masses, erythema, or discharge noted bilaterally.  Mild tenderness reported with palpation.   Extremities: No bilateral cyanosis, edema, or clubbing.    LABORATORY DATA: Lab Results  Component Value Date   WBC 7.8 12/02/2012   HGB 13.2 12/02/2012   HCT 39.0 12/02/2012   MCV 88.9 12/02/2012   PLT 282 12/02/2012  Chemistry      Component Value Date/Time   NA 137 12/02/2012 1306   NA 131* 06/19/2012 0807   K 3.8 12/02/2012 1306   K 3.7 06/19/2012 0807   CL 101 12/02/2012 1306   CL 92* 06/19/2012 0807   CO2 25 12/02/2012 1306   CO2 28 06/19/2012 0807   BUN 19.0 12/02/2012 1306   BUN 16 06/19/2012 0807   CREATININE 0.9 12/02/2012 1306   CREATININE 0.68 06/19/2012 0807      Component Value Date/Time   CALCIUM 9.9 12/02/2012 1306   CALCIUM 10.4 06/19/2012 0807   ALKPHOS 117 12/02/2012 1306   ALKPHOS 103 06/19/2012 0807   AST 30 12/02/2012 1306   AST 41* 06/19/2012 0807   ALT 16 12/02/2012 1306   ALT 24 06/19/2012 0807   BILITOT 0.37 12/02/2012 1306   BILITOT 0.4 06/19/2012 0807       RADIOGRAPHIC STUDIES:  No results found.  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    #2  She was enrolled in the B 43 protocol and underwent two cycles of Herceptin on 08/08/12 and 08/29/12.    #3  She completed radiation therapy under the care of Dr. Dayton Scrape from 08/06/12 to 09/16/12.    #4  She started Tamoxifen on 10/24/12 and has been on it since.   PLAN:  She is to continue taking Tamoxifen as prescribed and begin gabapentin 100 mg twice daily to assist with hot flash symptom relief per Dr. Welton Flakes.  She is advised to  follow up with her primary care provider about adjusting her blood pressure medications.  She is advised to move slowly when changing positions due to hypotensive episodes.  She is to follow up with Dr. Welton Flakes in 6 months with lab work at that time or sooner if needed.     All questions were answered. The patient knows to call the clinic with any problems, questions or concerns. We can certainly see the patient much sooner if necessary.  I spent 30 minutes counseling the patient face to face and Dr. Welton Flakes spent 10 minutes discussing future plans and recommendations. The total time spent in the appointment was 60 minutes.  Warner Mccreedy, NP Medical/Oncology [facility] [phone] (Office)  02/04/2013, 1:24 PM

## 2013-02-04 NOTE — Patient Instructions (Addendum)
Doing well.  Plan to continue taking Tamoxifen as prescribed.  Follow up with your primary care provider about adjusting your blood pressure medications.  Move slowly when changing positions since your blood pressure has been running low.  Plan to follow up with Dr. Welton Flakes in 6 months with lab work at that time.  Please call for any questions or concerns.                                                                                                            Gabapentin capsules or tablets What is this medicine? GABAPENTIN (GA ba pen tin) is used to control partial seizures in adults with epilepsy. It is also used to treat certain types of nerve pain. This medicine may be used for other purposes; ask your health care provider or pharmacist if you have questions. What should I tell my health care provider before I take this medicine? They need to know if you have any of these conditions: -kidney disease -suicidal thoughts, plans, or attempt; a previous suicide attempt by you or a family member -an unusual or allergic reaction to gabapentin, other medicines, foods, dyes, or preservatives -pregnant or trying to get pregnant -breast-feeding How should I use this medicine? Take this medicine by mouth. Swallow it with a drink of water. Follow the directions on the prescription label. If this medicine upsets your stomach, take it with food or milk. Take your medicine at regular intervals. Do not take it more often than directed. If you are directed to break the 600 or 800 mg tablets in half as part of your dose, the extra half tablet should be used for the next dose. If you have not used the extra half tablet within 3 days, it should be thrown away. A special MedGuide will be given to you by the pharmacist with each prescription and refill. Be sure to read this information carefully each time. Talk to your pediatrician regarding the use of this medicine in children. Special care may be needed. Overdosage:  If you think you have taken too much of this medicine contact a poison control center or emergency room at once. NOTE: This medicine is only for you. Do not share this medicine with others. What if I miss a dose? If you miss a dose, take it as soon as you can. If it is almost time for your next dose, take only that dose. Do not take double or extra doses. What may interact with this medicine? -antacids -hydrocodone -morphine -naproxen -sevelamer This list may not describe all possible interactions. Give your health care provider a list of all the medicines, herbs, non-prescription drugs, or dietary supplements you use. Also tell them if you smoke, drink alcohol, or use illegal drugs. Some items may interact with your medicine. What should I watch for while using this medicine? Visit your doctor or health care professional for regular checks on your progress. You may want to keep a record at home of how you feel your condition is responding to treatment. You may want  to share this information with your doctor or health care professional at each visit. You should contact your doctor or health care professional if your seizures get worse or if you have any new types of seizures. Do not stop taking this medicine or any of your seizure medicines unless instructed by your doctor or health care professional. Stopping your medicine suddenly can increase your seizures or their severity. Wear a medical identification bracelet or chain if you are taking this medicine for seizures, and carry a card that lists all your medications. You may get drowsy, dizzy, or have blurred vision. Do not drive, use machinery, or do anything that needs mental alertness until you know how this medicine affects you. To reduce dizzy or fainting spells, do not sit or stand up quickly, especially if you are an older patient. Alcohol can increase drowsiness and dizziness. Avoid alcoholic drinks. Your mouth may get dry. Chewing sugarless  gum or sucking hard candy, and drinking plenty of water will help. The use of this medicine may increase the chance of suicidal thoughts or actions. Pay special attention to how you are responding while on this medicine. Any worsening of mood, or thoughts of suicide or dying should be reported to your health care professional right away. Women who become pregnant while using this medicine may enroll in the [place] American Antiepileptic Drug Pregnancy Registry by calling [phone]. This registry collects information about the safety of antiepileptic drug use during pregnancy. What side effects may I notice from receiving this medicine? Side effects that you should report to your doctor or health care professional as soon as possible: -allergic reactions like skin rash, itching or hives, swelling of the face, lips, or tongue -worsening of mood, thoughts or actions of suicide or dying Side effects that usually do not require medical attention (report to your doctor or health care professional if they continue or are bothersome): -constipation -difficulty walking or controlling muscle movements -nausea -slurred speech -tremors -weight gain This list may not describe all possible side effects. Call your doctor for medical advice about side effects. You may report side effects to FDA at 1-800-FDA-1088. Where should I keep my medicine? Keep out of reach of children. Store at room temperature between 15 and 30 degrees C (59 and 86 degrees F). Throw away any unused medicine after the expiration date. NOTE: This sheet is a summary. It may not cover all possible information. If you have questions about this medicine, talk to your doctor, pharmacist, or health care provider.  2012, [employer]vier/Gold Standard. (06/28/2010 6:06:26 PM)

## 2013-02-12 ENCOUNTER — Encounter: Payer: Self-pay | Admitting: Cardiology

## 2013-05-14 ENCOUNTER — Other Ambulatory Visit: Payer: Self-pay | Admitting: Oncology

## 2013-05-14 DIAGNOSIS — C50911 Malignant neoplasm of unspecified site of right female breast: Secondary | ICD-10-CM

## 2013-05-15 ENCOUNTER — Other Ambulatory Visit: Payer: Self-pay | Admitting: Emergency Medicine

## 2013-05-15 DIAGNOSIS — C50911 Malignant neoplasm of unspecified site of right female breast: Secondary | ICD-10-CM

## 2013-05-15 MED ORDER — TAMOXIFEN CITRATE 20 MG PO TABS: 20.0000 mg | ORAL_TABLET | Freq: Every day | ORAL | Status: DC

## 2013-06-12 ENCOUNTER — Ambulatory Visit: Payer: BC Managed Care – PPO

## 2013-06-13 ENCOUNTER — Ambulatory Visit
Admission: RE | Admit: 2013-06-13 | Discharge: 2013-06-13 | Disposition: A | Discharge status: Home / Self Care | Payer: BC Managed Care – PPO | Source: Ambulatory Visit | Attending: Obstetrics and Gynecology | Admitting: Obstetrics and Gynecology

## 2013-06-13 DIAGNOSIS — Z853 Personal history of malignant neoplasm of breast: Secondary | ICD-10-CM

## 2013-08-08 ENCOUNTER — Other Ambulatory Visit: Payer: Self-pay | Admitting: Medical Oncology

## 2013-08-08 DIAGNOSIS — C50419 Malignant neoplasm of upper-outer quadrant of unspecified female breast: Secondary | ICD-10-CM

## 2013-08-11 ENCOUNTER — Other Ambulatory Visit (HOSPITAL_BASED_OUTPATIENT_CLINIC_OR_DEPARTMENT_OTHER): Payer: BC Managed Care – PPO | Admitting: Lab

## 2013-08-11 ENCOUNTER — Ambulatory Visit (HOSPITAL_BASED_OUTPATIENT_CLINIC_OR_DEPARTMENT_OTHER): Payer: BC Managed Care – PPO | Admitting: Oncology

## 2013-08-11 ENCOUNTER — Telehealth: Payer: Self-pay | Admitting: Oncology

## 2013-08-11 ENCOUNTER — Encounter: Payer: Self-pay | Admitting: Oncology

## 2013-08-11 VITALS — BP 127/76 | HR 73 | Temp 98.3°F | Resp 18 | Ht 66.0 in | Wt 199.4 lb

## 2013-08-11 DIAGNOSIS — C50419 Malignant neoplasm of upper-outer quadrant of unspecified female breast: Secondary | ICD-10-CM

## 2013-08-11 DIAGNOSIS — C50411 Malignant neoplasm of upper-outer quadrant of right female breast: Secondary | ICD-10-CM

## 2013-08-11 DIAGNOSIS — D0590 Unspecified type of carcinoma in situ of unspecified breast: Secondary | ICD-10-CM

## 2013-08-11 DIAGNOSIS — Z170 Estrogen receptor positive status [ER+]: Secondary | ICD-10-CM

## 2013-08-11 LAB — COMPREHENSIVE METABOLIC PANEL (CC13)
ALT: 22 U/L (ref 0–55)
AST: 37 U/L — ABNORMAL HIGH (ref 5–34)
BUN: 20 mg/dL (ref 7.0–26.0)
Calcium: 10 mg/dL (ref 8.4–10.4)
Chloride: 103 mEq/L (ref 98–109)
Creatinine: 0.8 mg/dL (ref 0.6–1.1)
Total Bilirubin: 0.27 mg/dL (ref 0.20–1.20)

## 2013-08-11 LAB — CBC WITH DIFFERENTIAL/PLATELET
BASO%: 0.8 % (ref 0.0–2.0)
Basophils Absolute: 0.1 10*3/uL (ref 0.0–0.1)
EOS%: 1.2 % (ref 0.0–7.0)
HCT: 37.8 % (ref 34.8–46.6)
HGB: 12.7 g/dL (ref 11.6–15.9)
LYMPH%: 24.6 % (ref 14.0–49.7)
MCH: 30.4 pg (ref 25.1–34.0)
MCHC: 33.7 g/dL (ref 31.5–36.0)
MCV: 90.3 fL (ref 79.5–101.0)
MONO%: 6.9 % (ref 0.0–14.0)
NEUT%: 66.5 % (ref 38.4–76.8)
Platelets: 285 10*3/uL (ref 145–400)
lymph#: 2.4 10*3/uL (ref 0.9–3.3)

## 2013-08-11 MED ORDER — TAMOXIFEN CITRATE 20 MG PO TABS: 20.0000 mg | ORAL_TABLET | Freq: Every day | ORAL | Status: DC

## 2013-08-11 NOTE — Progress Notes (Signed)
OFFICE PROGRESS NOTE  CC:   PCP: Dr. Dewain Penning   GYN: Dr. Marcelle Overlie   SU:  Dr. Jamey Ripa   GI:  Dr. Madilyn Fireman  DIAGNOSIS:  [name] is a 52 year old [place] woman, who underwent annual screening mammography.  On 05/26/2011, she had a screening study which was abnormal and she was referred for diagnostic study of the right breast, which showed calcifications in the right breast. A follow up mammogram in January 2013 was also felt to be benign. A subsequent mammogram on 06/11/2012 showed an increase in the number of pleomorphic calcifications upper-outer quadrant right breast. Biopsy was recommended. 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  MRI of the breasts on 06/18/2012 showed an area of nodular enhancement in the upper-outer quadrant measuring 1.6 x 1.2 x 1 cm. No other abnormalities were seen.    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  She was enrolled in the B 43 protocol and underwent two cycles of Herceptin on 08/08/12 and 08/29/12.  She completed radiation therapy under the care of Dr. Dayton Scrape from 08/06/12 to 09/16/12.  She started Tamoxifen on 10/24/12 and has been on it since.   CURRENT THERAPY:  Tamoxifen 20 mg daily since December 2013.  INTERVAL HISTORY: [name] 52 y.o. female returns for continued follow up.  She has been tolerating Tamoxifen well since December 2013. Patient had been on Effexor but she discontinued it. She has also discontinued gabapentin. She denies vaginal discharge or bleeding.    She reports intermittent soreness around the right breast and axilla.  She states that she is right handed and has to use to right arm frequently at work.  No other concerns voiced. Remainder of the 10 point review of systems is negative.  MEDICAL HISTORY: Past Medical History  Diagnosis Date  .  Hypertension   . Palpitations   . Ectopic pregnancy     x1  . Obesity   . Hyperlipidemia   . Fatigue   . Diarrhea   . Insomnia   . Lower extremity edema     Resolved  . Hives     Etiology questionable  . Fatty liver     History of Ftty Liver  . Breast cancer 06/11/12    biopsy, ER/PR +  . Diabetes mellitus   . Contact lens/glasses fitting   . Snores     ALLERGIES:  is allergic to zithromax and erythromycin.  MEDICATIONS:  Current Outpatient Prescriptions  Medication Sig Dispense Refill  . aspirin 81 MG tablet Take 81 mg by mouth daily.        . Blood Glucose Monitoring Suppl (FREESTYLE FREEDOM LITE) W/DEVICE KIT       . Cetirizine HCl (ZYRTEC PO) Take by mouth daily.        . cycloSPORINE (RESTASIS) 0.05 % ophthalmic emulsion Place 1 drop into both eyes 2 (two) times daily.      Marland Kitchen EXFORGE HCT 5-160-12.5 MG TABS       . fluticasone (FLONASE) 50 MCG/ACT nasal spray Place 2 sprays into the nose daily.      Marland Kitchen FREESTYLE LITE test strip       . gabapentin (NEURONTIN) 100 MG capsule Take 1 capsule (100 mg total) by mouth 2 (two) times daily.  30 capsule  4  . lactobacillus acidophilus (BACID) TABS Take  2 tablets by mouth 3 (three) times daily.      . Lancets (FREESTYLE) lancets       . metoprolol (TOPROL-XL) 50 MG 24 hr tablet TAKE 1 TABLET BY MOUTH TWICE DAILY  60 tablet  0  . Multiple Vitamin (MULTIVITAMIN) tablet Take 1 tablet by mouth daily.        . ranitidine (ZANTAC) 300 MG tablet       . tamoxifen (NOLVADEX) 20 MG tablet TAKE 1 TABLET BY MOUTH EVERY DAY  30 tablet  3  . tamoxifen (NOLVADEX) 20 MG tablet Take 1 tablet (20 mg total) by mouth daily.  30 tablet  12  . valsartan-hydrochlorothiazide (DIOVAN-HCT) 320-12.5 MG per tablet       . venlafaxine XR (EFFEXOR-XR) 150 MG 24 hr capsule Take 150 mg by mouth daily.       No current facility-administered medications for this visit.    SURGICAL HISTORY:  Past Surgical History  Procedure Laterality Date  . Tubal ligation       At age 27  . Ectopic pregnancy surgery  2003    x 2  . Dilation and curettage of uterus    . Tubal ligation      reversal  . Breast surgery  06/21/12    Rt br lumpectomy  . Abdominal hysterectomy  2008    lt ovary out, endometriosis, fibroids    REVIEW OF SYSTEMS:  Constitutional: Feels fatigued related to recent low blood pressures.  Cardiovascular: No chest pain, shortness of breath, or edema.  Pulmonary: No cough or wheeze.  Gastrointestinal: No nausea, vomiting, or diarrhea. No bright red blood per rectum or change in bowel movement.  Genitourinary: No frequency, urgency, or dysuria. No vaginal bleeding or discharge.  Musculoskeletal: No myalgia or joint pain. Neurologic: No weakness, numbness, or change in gait.  Psychology: No depression, anxiety, or insomnia.  HEALTH MAINTENANCE: Mammogram:  06/11/12 Colonoscopy: 04/2012 Pap Smear:  July 2013 Eye Exam: Yearly Lipid Panel: 04/2011 per Epic  PHYSICAL EXAMINATION: Blood pressure 127/76, pulse 73, temperature 98.3 F (36.8 C), temperature source Oral, resp. rate 18, height 5\' 6"  (1.676 m), weight 199 lb 6.4 oz (90.447 kg). Body mass index is 32.2 kg/(m^2). ECOG PERFORMANCE STATUS: 1 - Symptomatic but completely ambulatory  General: Well developed, well nourished female in no acute distress. Alert and oriented x 3.  Head/ Neck: Oropharynx clear.  Sclerae anicteric.  Supple without any enlargements.  Lymph node survey: No cervical, supraclavicular, or axillary adenopathy  Cardiovascular: Regular rate and rhythm. S1 and S2 normal.  Lungs: Clear to auscultation bilaterally. No wheezes/crackles/rhonchi noted.  Skin: No rashes or lesions present. Back: No CVA tenderness.  Abdomen: Abdomen soft, non-tender and obese. Active bowel sounds in all quadrants. No evidence of a fluid wave or abdominal masses.  Breasts: Inspection negative with no nodularity, masses, erythema, or discharge noted bilaterally.  Mild tenderness reported  with palpation.   Extremities: No bilateral cyanosis, edema, or clubbing.    LABORATORY DATA: Lab Results  Component Value Date   WBC 9.9 08/11/2013   HGB 12.7 08/11/2013   HCT 37.8 08/11/2013   MCV 90.3 08/11/2013   PLT 285 08/11/2013      Chemistry      Component Value Date/Time   NA 137 12/02/2012 1306   NA 131* 06/19/2012 0807   K 3.8 12/02/2012 1306   K 3.7 06/19/2012 0807   CL 101 12/02/2012 1306   CL 92* 06/19/2012 0807   CO2 25 12/02/2012  1306   CO2 28 06/19/2012 0807   BUN 19.0 12/02/2012 1306   BUN 16 06/19/2012 0807   CREATININE 0.9 12/02/2012 1306   CREATININE 0.68 06/19/2012 0807      Component Value Date/Time   CALCIUM 9.9 12/02/2012 1306   CALCIUM 10.4 06/19/2012 0807   ALKPHOS 117 12/02/2012 1306   ALKPHOS 103 06/19/2012 0807   AST 30 12/02/2012 1306   AST 41* 06/19/2012 0807   ALT 16 12/02/2012 1306   ALT 24 06/19/2012 0807   BILITOT 0.37 12/02/2012 1306   BILITOT 0.4 06/19/2012 0807       RADIOGRAPHIC STUDIES:  No results found.  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    #2  She was enrolled in the B 43 protocol and underwent two cycles of Herceptin on 08/08/12 and 08/29/12.    #3  She completed radiation therapy under the care of Dr. Dayton Scrape from 08/06/12 to 09/16/12.    #4  She started Tamoxifen on 10/24/12 and has been on it since.   PLAN:    #1 continue tamoxifen 20 mg daily.  #2 patient and I discussed the possibility of her trying acupuncture for her hot flashes as well as yoga medication.  #30 see her back in 6 months time for followup   All questions were answered. The patient knows to call the clinic with any problems, questions or concerns. We can certainly see the patient much sooner if necessary. The length of time of the face-to-face encounter was 25    minutes. More than 50% of time was spent  counseling and coordination of care.     Drue Second, MD Medical/Oncology [facility] [phone] (beeper) [phone] (Office)  08/11/2013, 2:29 PM

## 2013-08-11 NOTE — Patient Instructions (Addendum)
Continue tamoxifen 20 mg  We will see you back in 6 months  Acupuncture Acupuncture began in [place] some 3,000 years ago. It has been widely used in [place] since the early 1900s to relieve pain. A growing number of doctors find this method to be useful but cannot medically explain how it works. Patients are asking about acupuncture more often, especially when standard treatments have not made their pain go away. The theory that, in part, acupuncture works through the release of brain neurotransmitters is gaining scientific support. Some evidence shows that acupuncture causes a release of endorphins in the brain. Endorphins are chemicals the body produces to feel good or to feel less pain. PROCEDURE To treat pain, caregivers choose exact points to place needles. Points are chosen from within the same nerve (neural) segment next to the area of pain. Points may also be chosen from the arms and legs where many muscles are located. One of the most effective acupuncture points, Hoku, is located at the base of the thumb. The thumb has the largest number of connections to the brain. TREATMENT   Acupuncture needles are placed deeply into a muscle. This causes changes in the central nervous system that make pain go away. The relief from pain lasts a long time.  Other forms of treatment such as moxibustion may be used at the acupuncture point. This is a form of heat therapy.  A variety of massage and movement techniques may also be used to relieve pain. National [employer] [facility]) Document Released: 11/02/2003 Document Revised: 01/22/2012 Document Reviewed: 04/15/2009 [facility] Patient Information 2014 [place], [place].

## 2013-12-18 ENCOUNTER — Ambulatory Visit (HOSPITAL_BASED_OUTPATIENT_CLINIC_OR_DEPARTMENT_OTHER)
Admission: RE | Admit: 2013-12-18 | Discharge: 2013-12-18 | Disposition: A | Discharge status: Home / Self Care | Payer: BC Managed Care – PPO | Source: Ambulatory Visit | Attending: Family Medicine | Admitting: Family Medicine

## 2013-12-18 ENCOUNTER — Other Ambulatory Visit (HOSPITAL_BASED_OUTPATIENT_CLINIC_OR_DEPARTMENT_OTHER): Payer: Self-pay | Admitting: Family Medicine

## 2013-12-18 DIAGNOSIS — R1011 Right upper quadrant pain: Secondary | ICD-10-CM | POA: Insufficient documentation

## 2013-12-18 DIAGNOSIS — K7689 Other specified diseases of liver: Secondary | ICD-10-CM | POA: Insufficient documentation

## 2013-12-19 ENCOUNTER — Other Ambulatory Visit (HOSPITAL_COMMUNITY): Payer: Self-pay | Admitting: Family Medicine

## 2013-12-19 DIAGNOSIS — R1011 Right upper quadrant pain: Secondary | ICD-10-CM

## 2013-12-29 ENCOUNTER — Ambulatory Visit (HOSPITAL_COMMUNITY): Payer: BC Managed Care – PPO

## 2014-01-09 ENCOUNTER — Ambulatory Visit (HOSPITAL_COMMUNITY)
Admission: RE | Admit: 2014-01-09 | Discharge: 2014-01-09 | Disposition: A | Discharge status: Home / Self Care | Payer: BC Managed Care – PPO | Source: Ambulatory Visit | Attending: Family Medicine | Admitting: Family Medicine

## 2014-01-09 DIAGNOSIS — R1011 Right upper quadrant pain: Secondary | ICD-10-CM | POA: Insufficient documentation

## 2014-01-09 MED ORDER — SINCALIDE 5 MCG IJ SOLR: 0.0200 ug/kg | Freq: Once | INTRAMUSCULAR | Status: DC

## 2014-01-09 MED ORDER — STERILE WATER FOR INJECTION IJ SOLN
INTRAMUSCULAR | Status: AC
  Filled 2014-01-09: qty 10

## 2014-01-09 MED ORDER — TECHNETIUM TC 99M MEBROFENIN IV KIT
5.0000 | PACK | Freq: Once | INTRAVENOUS | Status: AC | PRN
  Administered 2014-01-09: 5 via INTRAVENOUS

## 2014-01-09 MED ORDER — SINCALIDE 5 MCG IJ SOLR
INTRAMUSCULAR | Status: AC
  Administered 2014-01-09: 1.87 ug via INTRAVENOUS
  Filled 2014-01-09: qty 5

## 2014-01-27 ENCOUNTER — Telehealth: Payer: Self-pay | Admitting: Adult Health

## 2014-01-27 NOTE — Telephone Encounter (Signed)
, °

## 2014-01-30 ENCOUNTER — Other Ambulatory Visit: Payer: BC Managed Care – PPO

## 2014-01-30 ENCOUNTER — Ambulatory Visit: Payer: BC Managed Care – PPO | Admitting: Adult Health

## 2014-02-10 NOTE — Progress Notes (Signed)
Hematology and Oncology Follow Up Visit  [name] [id number] 02-01-1961 53 y.o. 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 HER2 was determined to be 3+. She was enrolled in the B 43 protocol and underwent two cycles of Herceptin on 08/08/12 and 08/29/12. She completed radiation therapy under the care of Dr. Valere Dross from 08/06/12 to 09/16/12. She started Tamoxifen on 10/24/12 and has been on it since.   Current therapy:  Tamoxifen daily  Interim History: [name] 53 y.o. female is here for f/u and evaluation of her h/o breast cancer.  She is taking Tamoxifen daily and is tolerating it moderately well.  She is c/o fatigue today.  She has also noticed mild constipation, dry skin, weight gain.  She otherwise, is doing well and a 10 point ROS is negative.   Medications:  Current Outpatient Prescriptions  Medication Sig Dispense Refill  . aspirin 81 MG tablet Take 81 mg by mouth daily.        Marland Kitchen BETA CAROTENE PO Take 1 tablet by mouth every morning. TAKE 1000 IU daily      . Blood Glucose Monitoring Suppl (FREESTYLE FREEDOM LITE) W/DEVICE KIT       . Cholecalciferol (VITAMIN D3) 2000 UNITS TABS Take 1 tablet by mouth.      . Coenzyme Q10 200 MG capsule Take 200 mg by mouth daily.      . Cyanocobalamin (VITAMIN B-12) 500 MCG SUBL Place 1 tablet under the tongue every morning.      . cycloSPORINE (RESTASIS) 0.05 % ophthalmic emulsion Place 1 drop into both eyes 2 (two) times daily.      Marland Kitchen lactobacillus acidophilus (BACID) TABS Take 2 tablets by mouth 3 (three) times daily.      . Lancets (FREESTYLE) lancets       . Magnesium 250 MG TABS Take 1 tablet by mouth every morning.      . metFORMIN (GLUCOPHAGE) 500 MG tablet Take 500 mg by mouth. Take 2 500 mg tabs 2 x a day.      .  metoprolol (TOPROL-XL) 50 MG 24 hr tablet TAKE 1 TABLET BY MOUTH TWICE DAILY  60 tablet  0  . Multiple Vitamin (MULTIVITAMIN) tablet Take 1 tablet by mouth daily.        . pantoprazole (PROTONIX) 40 MG tablet Take 40 mg by mouth daily.      Marland Kitchen POTASSIUM CITRATE PO Take 1 tablet by mouth every morning. Take (1) 99 mg tablet daily.      . tamoxifen (NOLVADEX) 20 MG tablet Take 1 tablet (20 mg total) by mouth daily.  30 tablet  12  . valsartan (DIOVAN) 160 MG tablet daily.       . Cetirizine HCl (ZYRTEC PO) Take by mouth daily.        . fluticasone (FLONASE) 50 MCG/ACT nasal spray Place 2 sprays into the nose daily.      Marland Kitchen FREESTYLE LITE test strip        No current facility-administered medications for this visit.     Allergies:  Allergies  Allergen Reactions  . Zithromax [Azithromycin] Nausea And Vomiting  . Erythromycin Nausea Only    Stomach cramps    Medical History: Past Medical History  Diagnosis Date  . Hypertension   . Palpitations   .  Ectopic pregnancy     x1  . Obesity   . Hyperlipidemia   . Fatigue   . Diarrhea   . Insomnia   . Lower extremity edema     Resolved  . Hives     Etiology questionable  . Fatty liver     History of Ftty Liver  . Breast cancer 06/11/12    biopsy, ER/PR +  . Diabetes mellitus   . Contact lens/glasses fitting   . Snores     Surgical History:  Past Surgical History  Procedure Laterality Date  . Tubal ligation      At age 36  . Ectopic pregnancy surgery  2003    x 2  . Dilation and curettage of uterus    . Tubal ligation      reversal  . Breast surgery  06/21/12    Rt br lumpectomy  . Abdominal hysterectomy  2008    lt ovary out, endometriosis, fibroids     Review of Systems: A 10 point review of systems was conducted and is otherwise negative except for what is noted above.    Health Maintenance  Mammogram: 06/13/13 Colonoscopy: 04/2012 Bone Density Scan: never Pap Smear: 05/2013 Eye Exam: 07/2013 Lipid Panel: due in  May    Physical Exam: Blood pressure 134/79, pulse 65, temperature 98.2 F (36.8 C), temperature source Oral, resp. rate 18, height _0  (1.676 m), weight 205 lb 4.8 oz (93.123 kg). GENERAL: Patient is a well appearing female in no acute distress HEENT:  Sclerae anicteric.  Oropharynx clear and moist. No ulcerations or evidence of oropharyngeal candidiasis. Neck is supple.  NODES:  No cervical, supraclavicular, or axillary lymphadenopathy palpated.  BREAST EXAM:  LUNGS:  Clear to auscultation bilaterally.  No wheezes or rhonchi. HEART:  Regular rate and rhythm. No murmur appreciated. ABDOMEN:  Soft, nontender.  Positive, normoactive bowel sounds. No organomegaly palpated. MSK:  No focal spinal tenderness to palpation. Full range of motion bilaterally in the upper extremities. EXTREMITIES:  No peripheral edema.   SKIN:  Clear with no obvious rashes or skin changes. No nail dyscrasia. NEURO:  Nonfocal. Well oriented.  Appropriate affect. ECOG PERFORMANCE STATUS: 1 - Symptomatic but completely ambulatory   Lab Results: Lab Results  Component Value Date   WBC 7.6 02/11/2014   HGB 12.4 02/11/2014   HCT 37.2 02/11/2014   MCV 91.2 02/11/2014   PLT 244 02/11/2014     Chemistry      Component Value Date/Time   NA 142 02/11/2014 1451   NA 131* 06/19/2012 0807   K 4.0 02/11/2014 1451   K 3.7 06/19/2012 0807   CL 101 12/02/2012 1306   CL 92* 06/19/2012 0807   CO2 25 02/11/2014 1451   CO2 28 06/19/2012 0807   BUN 16.4 02/11/2014 1451   BUN 16 06/19/2012 0807   CREATININE 0.7 02/11/2014 1451   CREATININE 0.68 06/19/2012 0807      Component Value Date/Time   CALCIUM 9.8 02/11/2014 1451   CALCIUM 10.4 06/19/2012 0807   ALKPHOS 93 02/11/2014 1451   ALKPHOS 103 06/19/2012 0807   AST 36* 02/11/2014 1451   AST 41* 06/19/2012 0807   ALT 20 02/11/2014 1451   ALT 24 06/19/2012 0807   BILITOT 0.24 02/11/2014 1451   BILITOT 0.4 06/19/2012 0807        Assessment and Plan: [name] 53 y.o. female with  1. DCIS, triple  positive.  The patient has underwent lumpectomy Herceptin, radiation, and  is currently taking adjuvant Tamoxifen daily.  She is tolerating this medication well and will continue this.  Her CBC is stable.  I reviewed this with her in detail.  A CMP is pending.  We reviewed her health maintenance above.  I counseled her on survivorship including diet, exercise, and self breast exams.    The patient will return in 6 months for labs and evaluation.  She knows to contact us in the interim for any questions or concerns.  We can certainly see her sooner if needed.   I spent 15 minutes counseling the patient face to face.  The total time spent in the appointment was 30 minutes.  Minette Headland, [place] [phone] 02/11/2014 3:39 PM

## 2014-02-11 ENCOUNTER — Encounter: Payer: Self-pay | Admitting: Adult Health

## 2014-02-11 ENCOUNTER — Ambulatory Visit (HOSPITAL_BASED_OUTPATIENT_CLINIC_OR_DEPARTMENT_OTHER): Payer: BC Managed Care – PPO | Admitting: Adult Health

## 2014-02-11 ENCOUNTER — Other Ambulatory Visit (HOSPITAL_BASED_OUTPATIENT_CLINIC_OR_DEPARTMENT_OTHER): Payer: BC Managed Care – PPO

## 2014-02-11 ENCOUNTER — Telehealth: Payer: Self-pay | Admitting: Adult Health

## 2014-02-11 VITALS — BP 134/79 | HR 65 | Temp 98.2°F | Resp 18 | Ht 66.0 in | Wt 205.3 lb

## 2014-02-11 DIAGNOSIS — Z170 Estrogen receptor positive status [ER+]: Secondary | ICD-10-CM

## 2014-02-11 DIAGNOSIS — C50419 Malignant neoplasm of upper-outer quadrant of unspecified female breast: Secondary | ICD-10-CM

## 2014-02-11 DIAGNOSIS — C50411 Malignant neoplasm of upper-outer quadrant of right female breast: Secondary | ICD-10-CM

## 2014-02-11 DIAGNOSIS — D0590 Unspecified type of carcinoma in situ of unspecified breast: Secondary | ICD-10-CM

## 2014-02-11 DIAGNOSIS — R062 Wheezing: Secondary | ICD-10-CM

## 2014-02-11 LAB — CBC WITH DIFFERENTIAL/PLATELET
BASO%: 0.8 % (ref 0.0–2.0)
BASOS ABS: 0.1 10*3/uL (ref 0.0–0.1)
EOS ABS: 0.2 10*3/uL (ref 0.0–0.5)
EOS%: 3 % (ref 0.0–7.0)
HCT: 37.2 % (ref 34.8–46.6)
HEMOGLOBIN: 12.4 g/dL (ref 11.6–15.9)
LYMPH#: 2.5 10*3/uL (ref 0.9–3.3)
LYMPH%: 33.3 % (ref 14.0–49.7)
MCH: 30.4 pg (ref 25.1–34.0)
MCHC: 33.3 g/dL (ref 31.5–36.0)
MCV: 91.2 fL (ref 79.5–101.0)
MONO#: 0.6 10*3/uL (ref 0.1–0.9)
MONO%: 8.1 % (ref 0.0–14.0)
NEUT%: 54.8 % (ref 38.4–76.8)
NEUTROS ABS: 4.2 10*3/uL (ref 1.5–6.5)
Platelets: 244 10*3/uL (ref 145–400)
RBC: 4.08 10*6/uL (ref 3.70–5.45)
RDW: 13.5 % (ref 11.2–14.5)
WBC: 7.6 10*3/uL (ref 3.9–10.3)

## 2014-02-11 LAB — COMPREHENSIVE METABOLIC PANEL (CC13)
ALBUMIN: 3.7 g/dL (ref 3.5–5.0)
ALT: 20 U/L (ref 0–55)
AST: 36 U/L — AB (ref 5–34)
Alkaline Phosphatase: 93 U/L (ref 40–150)
Anion Gap: 10 mEq/L (ref 3–11)
BUN: 16.4 mg/dL (ref 7.0–26.0)
CALCIUM: 9.8 mg/dL (ref 8.4–10.4)
CO2: 25 mEq/L (ref 22–29)
Chloride: 106 mEq/L (ref 98–109)
Creatinine: 0.7 mg/dL (ref 0.6–1.1)
Glucose: 88 mg/dl (ref 70–140)
POTASSIUM: 4 meq/L (ref 3.5–5.1)
Sodium: 142 mEq/L (ref 136–145)
Total Bilirubin: 0.24 mg/dL (ref 0.20–1.20)
Total Protein: 7.3 g/dL (ref 6.4–8.3)

## 2014-02-11 MED ORDER — ALBUTEROL SULFATE HFA 108 (90 BASE) MCG/ACT IN AERS: 2.0000 | INHALATION_SPRAY | Freq: Four times a day (QID) | RESPIRATORY_TRACT | Status: DC | PRN

## 2014-02-11 NOTE — Telephone Encounter (Signed)
, °

## 2014-07-28 ENCOUNTER — Telehealth: Payer: Self-pay | Admitting: Adult Health

## 2014-07-28 ENCOUNTER — Other Ambulatory Visit: Payer: Self-pay

## 2014-07-28 ENCOUNTER — Other Ambulatory Visit: Payer: Self-pay | Admitting: Obstetrics and Gynecology

## 2014-07-28 DIAGNOSIS — Z853 Personal history of malignant neoplasm of breast: Secondary | ICD-10-CM

## 2014-07-28 NOTE — Telephone Encounter (Signed)
pt called to r/s appt..done...pt aware of new d.t °

## 2014-08-13 ENCOUNTER — Ambulatory Visit: Payer: BC Managed Care – PPO | Admitting: Adult Health

## 2014-08-13 ENCOUNTER — Other Ambulatory Visit: Payer: BC Managed Care – PPO

## 2014-09-17 ENCOUNTER — Other Ambulatory Visit: Payer: Self-pay | Admitting: *Deleted

## 2014-09-17 DIAGNOSIS — C50411 Malignant neoplasm of upper-outer quadrant of right female breast: Secondary | ICD-10-CM

## 2014-09-17 MED ORDER — TAMOXIFEN CITRATE 20 MG PO TABS: 20.0000 mg | ORAL_TABLET | Freq: Every day | ORAL | Status: DC

## 2014-10-06 ENCOUNTER — Other Ambulatory Visit: Payer: Self-pay

## 2014-10-06 DIAGNOSIS — C50419 Malignant neoplasm of upper-outer quadrant of unspecified female breast: Secondary | ICD-10-CM

## 2014-10-07 ENCOUNTER — Ambulatory Visit: Payer: BC Managed Care – PPO | Admitting: Hematology and Oncology

## 2014-10-07 ENCOUNTER — Other Ambulatory Visit: Payer: BC Managed Care – PPO

## 2014-10-07 ENCOUNTER — Telehealth: Payer: Self-pay | Admitting: Hematology and Oncology

## 2014-10-07 NOTE — Telephone Encounter (Signed)
, °

## 2014-10-18 ENCOUNTER — Other Ambulatory Visit: Payer: Self-pay | Admitting: Oncology

## 2014-10-19 ENCOUNTER — Telehealth: Payer: Self-pay | Admitting: Hematology and Oncology

## 2014-10-19 NOTE — Telephone Encounter (Signed)
Pt called and r/s miss Nov appt. Confirm appt 11/05/14.

## 2014-10-29 ENCOUNTER — Other Ambulatory Visit: Payer: Self-pay | Admitting: Obstetrics and Gynecology

## 2014-11-02 LAB — CYTOLOGY - PAP

## 2014-11-05 ENCOUNTER — Ambulatory Visit (HOSPITAL_BASED_OUTPATIENT_CLINIC_OR_DEPARTMENT_OTHER): Payer: BC Managed Care – PPO | Admitting: Hematology and Oncology

## 2014-11-05 ENCOUNTER — Telehealth: Payer: Self-pay | Admitting: *Deleted

## 2014-11-05 ENCOUNTER — Telehealth: Payer: Self-pay | Admitting: Hematology and Oncology

## 2014-11-05 ENCOUNTER — Other Ambulatory Visit (HOSPITAL_BASED_OUTPATIENT_CLINIC_OR_DEPARTMENT_OTHER): Payer: BC Managed Care – PPO

## 2014-11-05 VITALS — BP 137/57 | HR 66 | Temp 98.3°F | Resp 18 | Ht 66.0 in | Wt 208.1 lb

## 2014-11-05 DIAGNOSIS — D0511 Intraductal carcinoma in situ of right breast: Secondary | ICD-10-CM

## 2014-11-05 DIAGNOSIS — R252 Cramp and spasm: Secondary | ICD-10-CM

## 2014-11-05 DIAGNOSIS — E119 Type 2 diabetes mellitus without complications: Secondary | ICD-10-CM

## 2014-11-05 DIAGNOSIS — Z170 Estrogen receptor positive status [ER+]: Secondary | ICD-10-CM

## 2014-11-05 DIAGNOSIS — C50411 Malignant neoplasm of upper-outer quadrant of right female breast: Secondary | ICD-10-CM

## 2014-11-05 DIAGNOSIS — C50419 Malignant neoplasm of upper-outer quadrant of unspecified female breast: Secondary | ICD-10-CM

## 2014-11-05 NOTE — Telephone Encounter (Signed)
per pof to sch pt appt-gave pt copy of sch °

## 2014-11-05 NOTE — Telephone Encounter (Signed)
per pof to sch pty appt-gave pt copy of sch

## 2014-11-05 NOTE — Progress Notes (Signed)
Patient Care Team: Provider Not In System as PCP - General [name], MD (Hematology and Oncology) Rexene Edison, MD (Radiation Oncology) Haywood Lasso, MD as Surgeon (General Surgery) Margarette Asal, MD as Obstetrician (Obstetrics and Gynecology)  DIAGNOSIS: Primary cancer of upper outer quadrant of right female breast   Staging form: Breast, AJCC 7th Edition     Clinical: Stage 0 (Tis (DCIS), N0, cM0) - Unsigned       Staging comments: Staged at breast conference 8.7.13        Prognostic indicators: Receptor +      Pathologic: Stage Unknown (Tis, NX, cM0, Free text: Stage 0) - Signed by Haywood Lasso, MD on 07/01/2012       Prognostic indicators: Receptor +    SUMMARY OF ONCOLOGIC HISTORY:   Primary cancer of upper outer quadrant of right female breast   06/21/2012 Surgery Right breast lumpectomy: High-grade DCIS, 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 Valere Dross   08/08/2012 - 08/29/2012 Chemotherapy NSABP B43 clinical trial for DCIS: Patient was randomized to 2 doses of Herceptin   10/24/2012 -  Anti-estrogen oral therapy Tamoxifen 20 mg daily    CHIEF COMPLIANT: Follow-up on tamoxifen therapy for DCIS  INTERVAL HISTORY: [name] is a 53 year old lady with above-mentioned history of DCIS involving the right breast with lumpectomy followed by adjuvant radiation. She received Herceptin on NSABP B 43 clinical trial. She is currently on tamoxifen for the past 2 years and tolerating it fairly well. 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 She is complaining of leg cramps at night as well as fatigue. She was also diagnosed with diabetes and her hemoglobin A1c is improved from 10-8.8.  REVIEW OF SYSTEMS:   Constitutional: Denies fevers, chills or abnormal weight loss Eyes: Denies  blurriness of vision Ears, nose, mouth, throat, and face: Denies mucositis or sore throat Respiratory: Denies cough, dyspnea or wheezes Cardiovascular: Denies palpitation, chest discomfort or lower extremity swelling Gastrointestinal:  Denies nausea, heartburn or change in bowel habits Skin: Denies abnormal skin rashes Lymphatics: Denies new lymphadenopathy or easy bruising Neurological:Denies numbness, tingling or new weaknesses Behavioral/Psych: Mood is stable, no new changes  Breast:  denies any pain or lumps or nodules in either breasts All other systems were reviewed with the patient and are negative.  I have reviewed the past medical history, past surgical history, social history and family history with the patient and they are unchanged from previous note.  ALLERGIES:  is allergic to zithromax and erythromycin.  MEDICATIONS:  Current Outpatient Prescriptions  Medication Sig Dispense Refill  . albuterol (PROVENTIL HFA;VENTOLIN HFA) 108 (90 BASE) MCG/ACT inhaler Inhale 2 puffs into the lungs every 6 (six) hours as needed for wheezing or shortness of breath. 1 Inhaler 2  . aspirin 81 MG tablet Take 81 mg by mouth daily.      Marland Kitchen BETA CAROTENE PO Take 1 tablet by mouth every morning. TAKE 1000 IU daily    . Blood Glucose Monitoring Suppl (FREESTYLE FREEDOM LITE) W/DEVICE KIT     . Cetirizine HCl (ZYRTEC PO) Take by mouth daily.      . Cyanocobalamin (VITAMIN B-12) 500 MCG SUBL Place 1 tablet under the tongue every morning.    . cycloSPORINE (RESTASIS) 0.05 % ophthalmic emulsion Place 1 drop into both  eyes 2 (two) times daily.    Marland Kitchen FREESTYLE LITE test strip     . JANUVIA 100 MG tablet   6  . lactobacillus acidophilus (BACID) TABS Take 2 tablets by mouth 3 (three) times daily.    . Lancets (FREESTYLE) lancets     . Magnesium 250 MG TABS Take 1 tablet by mouth every morning.    . metFORMIN (GLUCOPHAGE) 500 MG tablet Take 500 mg by mouth. Take 2 500 mg tabs 2 x a day.    . metoprolol  (TOPROL-XL) 50 MG 24 hr tablet TAKE 1 TABLET BY MOUTH TWICE DAILY 60 tablet 0  . pantoprazole (PROTONIX) 40 MG tablet Take 40 mg by mouth daily.    Marland Kitchen POTASSIUM CITRATE PO Take 1 tablet by mouth every morning. Take (1) 99 mg tablet daily.    . tamoxifen (NOLVADEX) 20 MG tablet TAKE 1 TABLET BY MOUTH DAILY 30 tablet 0  . valsartan (DIOVAN) 160 MG tablet daily.     . Vitamin D, Ergocalciferol, (DRISDOL) 50000 UNITS CAPS capsule Every 2 weeks  2   No current facility-administered medications for this visit.    PHYSICAL EXAMINATION: ECOG PERFORMANCE STATUS: 1 - Symptomatic but completely ambulatory  Filed Vitals:   11/05/14 0859  BP: 137/57  Pulse: 66  Temp: 98.3 F (36.8 C)  Resp: 18   Filed Weights   11/05/14 0859  Weight: 208 lb 1.6 oz (94.394 kg)    GENERAL:alert, no distress and comfortable SKIN: skin color, texture, turgor are normal, no rashes or significant lesions EYES: normal, Conjunctiva are pink and non-injected, sclera clear OROPHARYNX:no exudate, no erythema and lips, buccal mucosa, and tongue normal  NECK: supple, thyroid normal size, non-tender, without nodularity LYMPH:  no palpable lymphadenopathy in the cervical, axillary or inguinal LUNGS: clear to auscultation and percussion with normal breathing effort HEART: regular rate & rhythm and no murmurs and no lower extremity edema ABDOMEN:abdomen soft, non-tender and normal bowel sounds Musculoskeletal:no cyanosis of digits and no clubbing  NEURO: alert & oriented x 3 with fluent speech, no focal motor/sensory deficits BREAST: No palpable masses or nodules in either right or left breasts. No palpable axillary supraclavicular or infraclavicular adenopathy no breast tenderness or nipple discharge.   LABORATORY DATA:  I have reviewed the data as listed   Chemistry      Component Value Date/Time   NA 142 02/11/2014 1451   NA 131* 06/19/2012 0807   K 4.0 02/11/2014 1451   K 3.7 06/19/2012 0807   CL 101 12/02/2012  1306   CL 92* 06/19/2012 0807   CO2 25 02/11/2014 1451   CO2 28 06/19/2012 0807   BUN 16.4 02/11/2014 1451   BUN 16 06/19/2012 0807   CREATININE 0.7 02/11/2014 1451   CREATININE 0.68 06/19/2012 0807      Component Value Date/Time   CALCIUM 9.8 02/11/2014 1451   CALCIUM 10.4 06/19/2012 0807   ALKPHOS 93 02/11/2014 1451   ALKPHOS 103 06/19/2012 0807   AST 36* 02/11/2014 1451   AST 41* 06/19/2012 0807   ALT 20 02/11/2014 1451   ALT 24 06/19/2012 0807   BILITOT 0.24 02/11/2014 1451   BILITOT 0.4 06/19/2012 0807       Lab Results  Component Value Date   WBC 7.6 02/11/2014   HGB 12.4 02/11/2014   HCT 37.2 02/11/2014   MCV 91.2 02/11/2014   PLT 244 02/11/2014   NEUTROABS 4.2 02/11/2014   ASSESSMENT & PLAN:  Primary cancer of upper outer quadrant  of right female breast Right breast DCIS high-grade ER/PR and HER-2 positive status post lumpectomy and radiation, treated with 2 doses of Herceptin on NSABP B43 clinical trial followed by adjuvant tamoxifen since December 2013.  Tamoxifen toxicities: Denies any hot flashes. Complains of muscle cramps late at night as well as tiredness. 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 I do not believe many of the symptoms of tamoxifen related. Blood work done recently by her family physician showed mild hypokalemia. These symptoms also be related to diabetes.  Breast cancer surveillance: 1. Breast exam 11/05/2014 normal 2. Mammogram scheduled for 11/09/2014  Survivorship:Discussed the importance of physical exercise in decreasing the likelihood of breast cancer recurrence. Recommended 30 mins daily 6 days a week of either brisk walking or cycling or swimming. Encouraged patient to eat more fruits and vegetables and decrease red meat.   We will see the patient in July 2016 for follow-up exam. I discussed with her that every 6 months will be continued for 3 years  and then annually thereafter     No orders of the defined types were placed in this encounter.   The patient has a good understanding of the overall plan. she agrees with it. She will call with any problems that may develop before her next visit here.   Rulon Eisenmenger, MD 11/05/2014 9:45 AM

## 2014-11-05 NOTE — Telephone Encounter (Signed)
Received labs from Dr. [place] with [facility]. Sent to scan.

## 2014-11-05 NOTE — Assessment & Plan Note (Addendum)
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  Tamoxifen toxicities: Denies any hot flashes. Complains of muscle cramps late at night as well as tiredness. 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 I do not believe many of the symptoms of tamoxifen related. Blood work done recently by her family physician showed mild hypokalemia. These symptoms also be related to diabetes.  Breast cancer surveillance: 1. Breast exam 11/05/2014 normal 2. Mammogram scheduled for 11/09/2014  Survivorship:Discussed the importance of physical exercise in decreasing the likelihood of breast cancer recurrence. Recommended 30 mins daily 6 days a week of either brisk walking or cycling or swimming. Encouraged patient to eat more fruits and vegetables and decrease red meat.   We will see the patient in July 2016 for follow-up exam. I discussed with her that every 6 months will be continued for 3 years and then annually thereafter

## 2014-11-09 ENCOUNTER — Ambulatory Visit
Admission: RE | Admit: 2014-11-09 | Discharge: 2014-11-09 | Disposition: A | Discharge status: Home / Self Care | Payer: BC Managed Care – PPO | Source: Ambulatory Visit | Attending: Obstetrics and Gynecology | Admitting: Obstetrics and Gynecology

## 2014-11-09 DIAGNOSIS — Z853 Personal history of malignant neoplasm of breast: Secondary | ICD-10-CM

## 2014-11-16 ENCOUNTER — Other Ambulatory Visit: Payer: Self-pay | Admitting: Hematology and Oncology

## 2014-12-18 ENCOUNTER — Other Ambulatory Visit: Payer: Self-pay | Admitting: *Deleted

## 2015-05-10 ENCOUNTER — Other Ambulatory Visit: Payer: Self-pay

## 2015-05-16 ENCOUNTER — Other Ambulatory Visit: Payer: Self-pay | Admitting: Hematology and Oncology

## 2015-05-18 ENCOUNTER — Other Ambulatory Visit: Payer: Self-pay | Admitting: *Deleted

## 2015-05-18 DIAGNOSIS — C50411 Malignant neoplasm of upper-outer quadrant of right female breast: Secondary | ICD-10-CM

## 2015-05-18 MED ORDER — TAMOXIFEN CITRATE 20 MG PO TABS: 20.0000 mg | ORAL_TABLET | Freq: Every day | ORAL | Status: DC

## 2015-05-21 ENCOUNTER — Ambulatory Visit (HOSPITAL_BASED_OUTPATIENT_CLINIC_OR_DEPARTMENT_OTHER): Payer: BC Managed Care – PPO | Admitting: Hematology and Oncology

## 2015-05-21 ENCOUNTER — Encounter: Payer: Self-pay | Admitting: Hematology and Oncology

## 2015-05-21 ENCOUNTER — Telehealth: Payer: Self-pay | Admitting: Hematology and Oncology

## 2015-05-21 VITALS — BP 127/54 | HR 74 | Temp 97.9°F | Resp 18 | Ht 66.0 in | Wt 215.9 lb

## 2015-05-21 DIAGNOSIS — Z79810 Long term (current) use of selective estrogen receptor modulators (SERMs): Secondary | ICD-10-CM

## 2015-05-21 DIAGNOSIS — D0511 Intraductal carcinoma in situ of right breast: Secondary | ICD-10-CM

## 2015-05-21 DIAGNOSIS — C50411 Malignant neoplasm of upper-outer quadrant of right female breast: Secondary | ICD-10-CM

## 2015-05-21 DIAGNOSIS — Z170 Estrogen receptor positive status [ER+]: Secondary | ICD-10-CM | POA: Diagnosis not present

## 2015-05-21 NOTE — Progress Notes (Signed)
Patient Care Team: Provider Not In System as PCP - General [name], MD (Hematology and Oncology) Arloa Koh, MD (Radiation Oncology) Neldon Mc, MD as Surgeon (General Surgery) Molli Posey, MD as Obstetrician (Obstetrics and Gynecology)  DIAGNOSIS: Primary cancer of upper outer quadrant of right female breast   Staging form: Breast, AJCC 7th Edition     Clinical: Stage 0 (Tis (DCIS), N0, cM0) - Unsigned       Staging comments: Staged at breast conference 8.7.13        Prognostic indicators: Receptor +      Pathologic: Stage Unknown (Tis, NX, cM0, Free text: Stage 0) - Signed by Haywood Lasso, MD on 07/01/2012       Prognostic indicators: Receptor +    SUMMARY OF ONCOLOGIC HISTORY:   Primary cancer of upper outer quadrant of right female breast   06/21/2012 Surgery Right breast lumpectomy: High-grade DCIS, 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 Valere Dross   08/08/2012 - 08/29/2012 Chemotherapy NSABP B43 clinical trial for DCIS: Patient was randomized to 2 doses of Herceptin   10/24/2012 -  Anti-estrogen oral therapy Tamoxifen 20 mg daily    CHIEF COMPLIANT: Follow-up on tamoxifen  INTERVAL HISTORY: [name] is a 54 year old with above-mentioned history of right breast DCIS currently on tamoxifen therapy. She has had multiple problems including hot flashes muscle cramps and fatigue. We are not certain whether they're related to tamoxifen therapy on because of his menopausal changes are the stress of work and being diabetic.  REVIEW OF SYSTEMS:   Constitutional: Denies fevers, chills or abnormal weight loss Eyes: Denies blurriness of vision Ears, nose, mouth, throat, and face: Denies mucositis or sore throat Respiratory: Denies cough, dyspnea or wheezes Cardiovascular: Denies palpitation, chest discomfort or lower extremity swelling Gastrointestinal:  Denies nausea, heartburn or change in  bowel habits Skin: Denies abnormal skin rashes Lymphatics: Denies new lymphadenopathy or easy bruising Neurological:Denies numbness, tingling or new weaknesses Behavioral/Psych: Mood is stable, no new changes  Breast:  denies any pain or lumps or nodules in either breasts All other systems were reviewed with the patient and are negative.  I have reviewed the past medical history, past surgical history, social history and family history with the patient and they are unchanged from previous note.  ALLERGIES:  is allergic to zithromax and erythromycin.  MEDICATIONS:  Current Outpatient Prescriptions  Medication Sig Dispense Refill  . albuterol (PROVENTIL HFA;VENTOLIN HFA) 108 (90 BASE) MCG/ACT inhaler Inhale 2 puffs into the lungs every 6 (six) hours as needed for wheezing or shortness of breath. 1 Inhaler 2  . aspirin 81 MG tablet Take 81 mg by mouth daily.      Marland Kitchen BETA CAROTENE PO Take 1 tablet by mouth every morning. TAKE 1000 IU daily    . Blood Glucose Monitoring Suppl (FREESTYLE FREEDOM LITE) W/DEVICE KIT     . Cetirizine HCl (ZYRTEC PO) Take by mouth daily.      . Cyanocobalamin (VITAMIN B-12) 500 MCG SUBL Place 1 tablet under the tongue every morning.    . cycloSPORINE (RESTASIS) 0.05 % ophthalmic emulsion Place 1 drop into both eyes 2 (two) times daily.    Marland Kitchen FREESTYLE LITE test strip     . glimepiride (AMARYL) 4 MG tablet TK 1 T PO QD WITH BREAKFAST OR FIRST MAIN MEAL OF THE DAY  0  . JANUMET XR 50-1000 MG TB24 TK 2 TS PO Q EVENING WITH FOOD  5  . lactobacillus acidophilus (BACID) TABS Take 2 tablets by mouth 3 (three) times daily.    . Lancets (FREESTYLE) lancets     . Magnesium 250 MG TABS Take 1 tablet by mouth every morning.    . metoprolol (TOPROL-XL) 50 MG 24 hr tablet TAKE 1 TABLET BY MOUTH TWICE DAILY 60 tablet 0  . pantoprazole (PROTONIX) 40 MG tablet Take 40 mg by mouth daily.    . tamoxifen (NOLVADEX) 20 MG tablet Take 1 tablet (20 mg total) by mouth daily. 30 tablet 5   . valsartan (DIOVAN) 160 MG tablet daily.     . Vitamin D, Ergocalciferol, (DRISDOL) 50000 UNITS CAPS capsule Every 2 weeks  2   No current facility-administered medications for this visit.    PHYSICAL EXAMINATION: ECOG PERFORMANCE STATUS: 1 - Symptomatic but completely ambulatory  Filed Vitals:   05/21/15 0851  BP: 127/54  Pulse: 74  Temp: 97.9 F (36.6 C)  Resp: 18   Filed Weights   05/21/15 0851  Weight: 215 lb 14.4 oz (97.932 kg)    GENERAL:alert, no distress and comfortable SKIN: skin color, texture, turgor are normal, no rashes or significant lesions EYES: normal, Conjunctiva are pink and non-injected, sclera clear OROPHARYNX:no exudate, no erythema and lips, buccal mucosa, and tongue normal  NECK: supple, thyroid normal size, non-tender, without nodularity LYMPH:  no palpable lymphadenopathy in the cervical, axillary or inguinal LUNGS: clear to auscultation and percussion with normal breathing effort HEART: regular rate & rhythm and no murmurs and no lower extremity edema ABDOMEN:abdomen soft, non-tender and normal bowel sounds Musculoskeletal:no cyanosis of digits and no clubbing  NEURO: alert & oriented x 3 with fluent speech, no focal motor/sensory deficits BREAST: No palpable masses or nodules in either right or left breasts. Right breast scar is tender No palpable axillary supraclavicular or infraclavicular adenopathy no breast tenderness or nipple discharge. (exam performed in the presence of a chaperone)  LABORATORY DATA:  I have reviewed the data as listed   Chemistry      Component Value Date/Time   NA 142 02/11/2014 1451   NA 131* 06/19/2012 0807   K 4.0 02/11/2014 1451   K 3.7 06/19/2012 0807   CL 101 12/02/2012 1306   CL 92* 06/19/2012 0807   CO2 25 02/11/2014 1451   CO2 28 06/19/2012 0807   BUN 16.4 02/11/2014 1451   BUN 16 06/19/2012 0807   CREATININE 0.7 02/11/2014 1451   CREATININE 0.68 06/19/2012 0807      Component Value Date/Time    CALCIUM 9.8 02/11/2014 1451   CALCIUM 10.4 06/19/2012 0807   ALKPHOS 93 02/11/2014 1451   ALKPHOS 103 06/19/2012 0807   AST 36* 02/11/2014 1451   AST 41* 06/19/2012 0807   ALT 20 02/11/2014 1451   ALT 24 06/19/2012 0807   BILITOT 0.24 02/11/2014 1451   BILITOT 0.4 06/19/2012 0807       Lab Results  Component Value Date   WBC 7.6 02/11/2014   HGB 12.4 02/11/2014   HCT 37.2 02/11/2014   MCV 91.2 02/11/2014   PLT 244 02/11/2014   NEUTROABS 4.2 02/11/2014     RADIOGRAPHIC STUDIES: I have personally reviewed the radiology reports and agreed with their findings. 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  Tamoxifen toxicities:  1. Muscle cramps 2.  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  Breast cancer surveillance: 1. Breast exam 05/21/15 normal 2. Mammogram 11/09/2014 normal breast density category B I discussed the different breast densities and their significance. Recommended continued annual surveillance with 3D mammograms.  Survivorship:Discussed the importance of physical exercise in decreasing the likelihood of breast cancer recurrence. Recommended 30 mins daily 6 days a week of either brisk walking or cycling or swimming. Encouraged patient to eat more fruits and vegetables and decrease red meat.   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  No orders of the defined types were placed in this encounter.   The patient has a good understanding of the overall plan. she agrees with it. she will call with any problems that may develop  before the next visit here.   Rulon Eisenmenger, MD

## 2015-05-21 NOTE — Telephone Encounter (Signed)
Gave adn printed appt sched adn avs for pt for July 2017

## 2015-05-21 NOTE — Assessment & Plan Note (Signed)
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  Tamoxifen toxicities:  1. Muscle cramps 2.  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  Breast cancer surveillance: 1. Breast exam 05/21/15 normal 2. Mammogram 11/09/2014 normal breast density category B I discussed the different breast densities and their significance. Recommended continued annual surveillance with 3D mammograms.  Survivorship:Discussed the importance of physical exercise in decreasing the likelihood of breast cancer recurrence. Recommended 30 mins daily 6 days a week of either brisk walking or cycling or swimming. Encouraged patient to eat more fruits and vegetables and decrease red meat.

## 2015-10-20 ENCOUNTER — Other Ambulatory Visit: Payer: Self-pay | Admitting: Obstetrics and Gynecology

## 2015-10-20 DIAGNOSIS — Z853 Personal history of malignant neoplasm of breast: Secondary | ICD-10-CM

## 2015-11-11 ENCOUNTER — Ambulatory Visit: Payer: BC Managed Care – PPO

## 2015-11-15 ENCOUNTER — Other Ambulatory Visit: Payer: Self-pay | Admitting: Hematology and Oncology

## 2015-11-17 NOTE — Telephone Encounter (Signed)
Chart reviewed.

## 2015-11-19 ENCOUNTER — Other Ambulatory Visit: Payer: Self-pay | Admitting: Obstetrics and Gynecology

## 2015-11-19 ENCOUNTER — Ambulatory Visit
Admission: RE | Admit: 2015-11-19 | Discharge: 2015-11-19 | Disposition: A | Discharge status: Home / Self Care | Payer: BC Managed Care – PPO | Source: Ambulatory Visit | Attending: Obstetrics and Gynecology | Admitting: Obstetrics and Gynecology

## 2015-11-19 DIAGNOSIS — Z853 Personal history of malignant neoplasm of breast: Secondary | ICD-10-CM

## 2016-03-07 ENCOUNTER — Other Ambulatory Visit: Payer: Self-pay | Admitting: Family Medicine

## 2016-05-19 ENCOUNTER — Ambulatory Visit: Payer: BC Managed Care – PPO | Admitting: Hematology and Oncology

## 2016-06-09 ENCOUNTER — Other Ambulatory Visit: Payer: Self-pay | Admitting: Hematology and Oncology

## 2016-07-11 ENCOUNTER — Telehealth: Payer: Self-pay | Admitting: Hematology and Oncology

## 2016-07-11 ENCOUNTER — Encounter: Payer: Self-pay | Admitting: Hematology and Oncology

## 2016-07-11 ENCOUNTER — Ambulatory Visit (HOSPITAL_BASED_OUTPATIENT_CLINIC_OR_DEPARTMENT_OTHER): Payer: BC Managed Care – PPO | Admitting: Hematology and Oncology

## 2016-07-11 DIAGNOSIS — C50411 Malignant neoplasm of upper-outer quadrant of right female breast: Secondary | ICD-10-CM

## 2016-07-11 DIAGNOSIS — Z79810 Long term (current) use of selective estrogen receptor modulators (SERMs): Secondary | ICD-10-CM | POA: Diagnosis not present

## 2016-07-11 DIAGNOSIS — Z170 Estrogen receptor positive status [ER+]: Secondary | ICD-10-CM

## 2016-07-11 MED ORDER — LIRAGLUTIDE 18 MG/3ML ~~LOC~~ SOPN: 1.2000 mg | PEN_INJECTOR | Freq: Every day | SUBCUTANEOUS | Status: DC

## 2016-07-11 NOTE — Assessment & Plan Note (Signed)
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  Tamoxifen toxicities:  1. Muscle cramps 2.  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  Breast cancer surveillance: 1. Breast exam 07/11/2016 normal 2. Mammogram 11/19/2015 normal breast density category B.  Survivorship:Discussed the importance of physical exercise in decreasing the likelihood of breast cancer recurrence. Recommended 30 mins daily 6 days a week of either brisk walking or cycling or swimming. Encouraged patient to eat more fruits and vegetables and decrease red meat.   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  Return to clinic in 1 year for follow-up

## 2016-07-11 NOTE — Progress Notes (Signed)
Patient Care Team: Provider Not In System as PCP - General [name], MD (Hematology and Oncology) Arloa Koh, MD (Radiation Oncology) Neldon Mc, MD as Surgeon (General Surgery) Molli Posey, MD as Obstetrician (Obstetrics and Gynecology)  DIAGNOSIS: Primary cancer of upper outer quadrant of right female breast [facility])   Staging form: Breast, AJCC 7th Edition   - Clinical: Stage 0 (Tis (DCIS), N0, cM0) - Unsigned         Prognostic indicators: Receptor +            Staging comments: Staged at breast conference 8.7.13    - Pathologic: Stage Unknown (Tis, NX, cM0, Free text: Stage 0) - Signed by Haywood Lasso, MD on 07/01/2012         Prognostic indicators: Receptor +     SUMMARY OF ONCOLOGIC HISTORY:   Primary cancer of upper outer quadrant of right female breast ([place])   06/21/2012 Surgery    Right breast lumpectomy: High-grade DCIS, 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 Valere Dross      08/08/2012 - 08/29/2012 Chemotherapy    NSABP B43 clinical trial for DCIS: Patient was randomized to 2 doses of Herceptin      10/24/2012 -  Anti-estrogen oral therapy    Tamoxifen 20 mg daily       CHIEF COMPLIANT: Follow-up on tamoxifen therapy  INTERVAL HISTORY: [name] is a 54 year old with above-mentioned history of right breast DCIS treated with lumpectomy, radiation and is currently on tamoxifen therapy. She is tolerating tamoxifen moderately well. She does have moderate degree of hot flashes. She also complains of fatigue. She is having problems with weight issues. Denies any arthralgias or myalgias.  REVIEW OF SYSTEMS:   Constitutional: Denies fevers, chills or abnormal weight loss Eyes: Denies blurriness of vision Ears, nose, mouth, throat, and face: Denies mucositis or sore throat Respiratory: Denies cough, dyspnea or wheezes Cardiovascular: Denies palpitation, chest  discomfort Gastrointestinal:  Denies nausea, heartburn or change in bowel habits Skin: Denies abnormal skin rashes Lymphatics: Denies new lymphadenopathy or easy bruising Neurological:Denies numbness, tingling or new weaknesses Behavioral/Psych: Mood is stable, no new changes  Extremities: No lower extremity edema Breast:  Intermittent pain in the right axilla and the muscle which feels like a cramp All other systems were reviewed with the patient and are negative.  I have reviewed the past medical history, past surgical history, social history and family history with the patient and they are unchanged from previous note.  ALLERGIES:  is allergic to zithromax [azithromycin] and erythromycin.  MEDICATIONS:  Current Outpatient Prescriptions  Medication Sig Dispense Refill  . albuterol (PROVENTIL HFA;VENTOLIN HFA) 108 (90 BASE) MCG/ACT inhaler Inhale 2 puffs into the lungs every 6 (six) hours as needed for wheezing or shortness of breath. 1 Inhaler 2  . aspirin 81 MG tablet Take 81 mg by mouth daily.      Marland Kitchen BETA CAROTENE PO Take 1 tablet by mouth every morning. TAKE 1000 IU daily    . Blood Glucose Monitoring Suppl (FREESTYLE FREEDOM LITE) W/DEVICE KIT     . Cetirizine HCl (ZYRTEC PO) Take by mouth daily.      . Cyanocobalamin (VITAMIN B-12) 500 MCG SUBL Place 1 tablet under the tongue every morning.    . cycloSPORINE (RESTASIS) 0.05 % ophthalmic emulsion Place 1 drop into both eyes 2 (two) times daily.    Marland Kitchen FREESTYLE LITE test strip     .  glimepiride (AMARYL) 4 MG tablet TK 1 T PO QD WITH BREAKFAST OR FIRST MAIN MEAL OF THE DAY  0  . JANUMET XR 50-1000 MG TB24 TK 2 TS PO Q EVENING WITH FOOD  5  . lactobacillus acidophilus (BACID) TABS Take 2 tablets by mouth 3 (three) times daily.    . Lancets (FREESTYLE) lancets     . Magnesium 250 MG TABS Take 1 tablet by mouth every morning.    . metoprolol (TOPROL-XL) 50 MG 24 hr tablet TAKE 1 TABLET BY MOUTH TWICE DAILY 60 tablet 0  . pantoprazole  (PROTONIX) 40 MG tablet TAKE 1 TABLET BY MOUTH EVERY DAY 30 tablet 5  . tamoxifen (NOLVADEX) 20 MG tablet TAKE 1 TABLET BY MOUTH EVERY DAY 30 tablet 0  . valsartan (DIOVAN) 160 MG tablet daily.     . Vitamin D, Ergocalciferol, (DRISDOL) 50000 UNITS CAPS capsule Every 2 weeks  2   No current facility-administered medications for this visit.     PHYSICAL EXAMINATION: ECOG PERFORMANCE STATUS: 1 - Symptomatic but completely ambulatory  Vitals:   07/11/16 1014  BP: (!) 115/52  Pulse: 68  Resp: 18  Temp: 97.5 F (36.4 C)   Filed Weights   07/11/16 1014  Weight: 207 lb 9.6 oz (94.2 kg)    GENERAL:alert, no distress and comfortable SKIN: skin color, texture, turgor are normal, no rashes or significant lesions EYES: normal, Conjunctiva are pink and non-injected, sclera clear OROPHARYNX:no exudate, no erythema and lips, buccal mucosa, and tongue normal  NECK: supple, thyroid normal size, non-tender, without nodularity LYMPH:  no palpable lymphadenopathy in the cervical, axillary or inguinal LUNGS: clear to auscultation and percussion with normal breathing effort HEART: regular rate & rhythm and no murmurs and no lower extremity edema ABDOMEN:abdomen soft, non-tender and normal bowel sounds MUSCULOSKELETAL:no cyanosis of digits and no clubbing  NEURO: alert & oriented x 3 with fluent speech, no focal motor/sensory deficits EXTREMITIES: No lower extremity edema BREAST: No palpable masses or nodules in either right or left breasts. No palpable axillary supraclavicular or infraclavicular adenopathy no breast tenderness or nipple discharge. (exam performed in the presence of a chaperone)  LABORATORY DATA:  I have reviewed the data as listed   Chemistry      Component Value Date/Time   NA 142 02/11/2014 1451   K 4.0 02/11/2014 1451   CL 101 12/02/2012 1306   CO2 25 02/11/2014 1451   BUN 16.4 02/11/2014 1451   CREATININE 0.7 02/11/2014 1451      Component Value Date/Time   CALCIUM  9.8 02/11/2014 1451   ALKPHOS 93 02/11/2014 1451   AST 36 (H) 02/11/2014 1451   ALT 20 02/11/2014 1451   BILITOT 0.24 02/11/2014 1451       Lab Results  Component Value Date   WBC 7.6 02/11/2014   HGB 12.4 02/11/2014   HCT 37.2 02/11/2014   MCV 91.2 02/11/2014   PLT 244 02/11/2014   NEUTROABS 4.2 02/11/2014   ASSESSMENT & PLAN:  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  Tamoxifen toxicities:  1. Muscle cramps 2.  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  Breast cancer surveillance: 1. Breast exam 07/11/2016 normal 2. Mammogram 11/19/2015 normal breast density category B.  Survivorship:Discussed the importance  of physical exercise in decreasing the likelihood of breast cancer recurrence. Recommended 30 mins daily 6 days a week of either brisk walking or cycling or swimming. Encouraged patient to eat more fruits and vegetables and decrease red meat.   Return to clinic in 1 year for follow-up  No orders of the defined types were placed in this encounter.  The patient has a good understanding of the overall plan. she agrees with it. she will call with any problems that may develop before the next visit here.   Rulon Eisenmenger, MD 07/11/16

## 2016-07-11 NOTE — Telephone Encounter (Signed)
appt made and avs printed °

## 2016-07-13 ENCOUNTER — Other Ambulatory Visit: Payer: Self-pay | Admitting: Hematology and Oncology

## 2016-08-31 ENCOUNTER — Other Ambulatory Visit: Payer: Self-pay | Admitting: Internal Medicine

## 2016-12-05 ENCOUNTER — Other Ambulatory Visit: Payer: Self-pay | Admitting: Obstetrics and Gynecology

## 2016-12-05 DIAGNOSIS — Z853 Personal history of malignant neoplasm of breast: Secondary | ICD-10-CM

## 2016-12-13 ENCOUNTER — Ambulatory Visit
Admission: RE | Admit: 2016-12-13 | Discharge: 2016-12-13 | Disposition: A | Discharge status: Home / Self Care | Payer: BC Managed Care – PPO | Source: Ambulatory Visit | Attending: Obstetrics and Gynecology | Admitting: Obstetrics and Gynecology

## 2016-12-13 DIAGNOSIS — Z853 Personal history of malignant neoplasm of breast: Secondary | ICD-10-CM

## 2017-02-19 ENCOUNTER — Other Ambulatory Visit: Payer: Self-pay | Admitting: Internal Medicine

## 2017-04-05 ENCOUNTER — Other Ambulatory Visit: Payer: Self-pay

## 2017-06-18 ENCOUNTER — Telehealth: Payer: Self-pay

## 2017-06-18 NOTE — Telephone Encounter (Signed)
Spoke with patient and she is aware of her new appt due to bmdc   [name] 

## 2017-07-10 ENCOUNTER — Encounter: Payer: Self-pay | Admitting: Hematology and Oncology

## 2017-07-10 ENCOUNTER — Ambulatory Visit (HOSPITAL_BASED_OUTPATIENT_CLINIC_OR_DEPARTMENT_OTHER): Payer: BC Managed Care – PPO | Admitting: Hematology and Oncology

## 2017-07-10 DIAGNOSIS — Z79810 Long term (current) use of selective estrogen receptor modulators (SERMs): Secondary | ICD-10-CM | POA: Diagnosis not present

## 2017-07-10 DIAGNOSIS — Z170 Estrogen receptor positive status [ER+]: Secondary | ICD-10-CM

## 2017-07-10 DIAGNOSIS — C50411 Malignant neoplasm of upper-outer quadrant of right female breast: Secondary | ICD-10-CM | POA: Diagnosis not present

## 2017-07-10 MED ORDER — ALPRAZOLAM 0.5 MG PO TABS: 0.5000 mg | ORAL_TABLET | Freq: Every evening | ORAL | Status: DC | PRN

## 2017-07-10 NOTE — Assessment & Plan Note (Signed)
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  Tamoxifen toxicities:  1. Muscle cramps 2. 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  Breast cancer surveillance: 1. Breast exam 07/10/2017  normal 2. Mammogram 12/13/2016  normal breast density category B.  Return to clinic in 1 year for follow-up

## 2017-07-10 NOTE — Progress Notes (Signed)
Patient Care Team: System, Provider Not In as PCP - General [name], MD (Inactive) (Hematology and Oncology) Arloa Koh, MD (Radiation Oncology) Neldon Mc, MD as Surgeon (General Surgery) Molli Posey, MD as Obstetrician (Obstetrics and Gynecology)  DIAGNOSIS:  Encounter Diagnosis  Name Primary?  . 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 Valere Dross      08/08/2012 - 08/29/2012 Chemotherapy    NSABP B43 clinical trial for DCIS: Patient was randomized to 2 doses of Herceptin      10/24/2012 - 07/10/2017 Anti-estrogen oral therapy    Tamoxifen 20 mg daily       CHIEF COMPLIANT: Follow-up on tamoxifen therapy  INTERVAL HISTORY: [name] is a 56 year old above-mentioned history of right breast cancer who is currently on tamoxifen therapy. She has been tolerating it fairly well. In May of this year husband passed away suddenly and she is grieving from his loss. She denies any lumps or nodules in the breasts. Mammograms done in January 2018 were normal.  REVIEW OF SYSTEMS:   Constitutional: Denies fevers, chills or abnormal weight loss Eyes: Denies blurriness of vision Ears, nose, mouth, throat, and face: Denies mucositis or sore throat Respiratory: Denies cough, dyspnea or wheezes Cardiovascular: Denies palpitation, chest discomfort Gastrointestinal:  Denies nausea, heartburn or change in bowel habits Skin: Denies abnormal skin rashes Lymphatics: Denies new lymphadenopathy or easy bruising Neurological:Denies numbness, tingling or new weaknesses Behavioral/Psych: Feeling depressed and is going through grief from loss of her husband    Extremities: No lower extremity edema Breast:  denies any pain or lumps or nodules in either breasts All other systems were reviewed with the patient and are negative.  I have reviewed the past medical history, past surgical history, social history and family history with the patient and they are unchanged from previous note.  ALLERGIES:  is allergic to zithromax [azithromycin] and erythromycin.  MEDICATIONS:  Current Outpatient Prescriptions  Medication Sig Dispense Refill  . ALPRAZolam (XANAX) 0.5 MG tablet Take 1 tablet (0.5 mg total) by mouth at bedtime as needed for anxiety.    Marland Kitchen aspirin 81 MG tablet Take 81 mg by mouth daily.      Marland Kitchen BETA CAROTENE PO Take 1 tablet by mouth every morning. TAKE 1000 IU daily    . Blood Glucose Monitoring Suppl (FREESTYLE FREEDOM LITE) W/DEVICE KIT     . Cetirizine HCl (ZYRTEC PO) Take by mouth daily.      Marland Kitchen FREESTYLE LITE test strip     . JANUMET XR 50-1000 MG TB24 TK 2 TS PO Q EVENING WITH FOOD  5  . lactobacillus acidophilus (BACID) TABS Take 2 tablets by mouth 3 (three) times daily.    . Lancets (FREESTYLE) lancets     . Liraglutide (VICTOZA) 18 MG/3ML SOPN Inject 0.2 mLs (1.2 mg total) into the skin daily. 6 mL   . Magnesium 250 MG TABS Take 1 tablet by mouth every morning.    . metoprolol (TOPROL-XL) 50 MG 24 hr tablet TAKE 1 TABLET BY MOUTH TWICE DAILY 60 tablet 0  . pantoprazole (PROTONIX) 40 MG tablet TAKE 1 TABLET BY MOUTH EVERY DAY 30 tablet 0  .  valsartan (DIOVAN) 160 MG tablet daily.     . Vitamin D, Ergocalciferol, (DRISDOL) 50000 UNITS CAPS capsule Every 2 weeks  2   No current facility-administered medications for this visit.     PHYSICAL EXAMINATION: ECOG PERFORMANCE STATUS: 1 - Symptomatic but completely ambulatory  Vitals:   07/10/17 1018  BP: 130/60  Pulse: (!) 58  Resp: 18  Temp: 98 F (36.7 C)  SpO2: 100%   Filed Weights   07/10/17 1018  Weight: 185 lb 8 oz (84.1 kg)    GENERAL:alert, no distress and  comfortable SKIN: skin color, texture, turgor are normal, no rashes or significant lesions EYES: normal, Conjunctiva are pink and non-injected, sclera clear OROPHARYNX:no exudate, no erythema and lips, buccal mucosa, and tongue normal  NECK: supple, thyroid normal size, non-tender, without nodularity LYMPH:  no palpable lymphadenopathy in the cervical, axillary or inguinal LUNGS: clear to auscultation and percussion with normal breathing effort HEART: regular rate & rhythm and no murmurs and no lower extremity edema ABDOMEN:abdomen soft, non-tender and normal bowel sounds MUSCULOSKELETAL:no cyanosis of digits and no clubbing  NEURO: alert & oriented x 3 with fluent speech, no focal motor/sensory deficits EXTREMITIES: No lower extremity edema  LABORATORY DATA:  I have reviewed the data as listed   Chemistry      Component Value Date/Time   NA 142 02/11/2014 1451   K 4.0 02/11/2014 1451   CL 101 12/02/2012 1306   CO2 25 02/11/2014 1451   BUN 16.4 02/11/2014 1451   CREATININE 0.7 02/11/2014 1451      Component Value Date/Time   CALCIUM 9.8 02/11/2014 1451   ALKPHOS 93 02/11/2014 1451   AST 36 (H) 02/11/2014 1451   ALT 20 02/11/2014 1451   BILITOT 0.24 02/11/2014 1451       Lab Results  Component Value Date   WBC 7.6 02/11/2014   HGB 12.4 02/11/2014   HCT 37.2 02/11/2014   MCV 91.2 02/11/2014   PLT 244 02/11/2014   NEUTROABS 4.2 02/11/2014    ASSESSMENT & PLAN:  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  Tamoxifen toxicities:  1. Muscle cramps 2. Fatigue Patient's husband passed away suddenly earlier this year. She is grieving from his loss. 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 She wishes to stop tamoxifen  therapy at this point. I believe that she has nearly completed her 5 years and it would be fine to discontinue it.  Breast cancer surveillance: 1. Breast exam 07/10/2017  normal 2. Mammogram 12/13/2016  normal breast density category B.  She wishes to follow up with her primary care physician [facility] for her annual breast exams and follow-ups. She will be seen on an as-needed basis.   I spent 15 minutes talking to the patient of which more than half was spent in counseling and coordination of care.  No orders of the defined types were placed in this encounter.  The patient has a good understanding of the overall plan. she agrees with it. she will call with any problems that may develop before the next visit here.   Rulon Eisenmenger, MD 07/10/17

## 2017-07-11 ENCOUNTER — Ambulatory Visit: Payer: BC Managed Care – PPO | Admitting: Hematology and Oncology

## 2017-07-15 ENCOUNTER — Other Ambulatory Visit: Payer: Self-pay | Admitting: Hematology and Oncology

## 2017-11-19 ENCOUNTER — Other Ambulatory Visit: Payer: Self-pay | Admitting: Obstetrics and Gynecology

## 2017-11-19 DIAGNOSIS — Z853 Personal history of malignant neoplasm of breast: Secondary | ICD-10-CM

## 2017-12-12 ENCOUNTER — Ambulatory Visit: Payer: BC Managed Care – PPO | Admitting: Cardiovascular Disease

## 2017-12-12 ENCOUNTER — Encounter: Payer: Self-pay | Admitting: Cardiovascular Disease

## 2017-12-12 VITALS — BP 124/72 | HR 62 | Ht 66.0 in | Wt 191.0 lb

## 2017-12-12 DIAGNOSIS — I10 Essential (primary) hypertension: Secondary | ICD-10-CM

## 2017-12-12 DIAGNOSIS — I208 Other forms of angina pectoris: Secondary | ICD-10-CM | POA: Diagnosis not present

## 2017-12-12 DIAGNOSIS — R072 Precordial pain: Secondary | ICD-10-CM

## 2017-12-12 DIAGNOSIS — E7800 Pure hypercholesterolemia, unspecified: Secondary | ICD-10-CM | POA: Diagnosis not present

## 2017-12-12 DIAGNOSIS — R079 Chest pain, unspecified: Secondary | ICD-10-CM | POA: Insufficient documentation

## 2017-12-12 DIAGNOSIS — E785 Hyperlipidemia, unspecified: Secondary | ICD-10-CM | POA: Insufficient documentation

## 2017-12-12 NOTE — Assessment & Plan Note (Signed)
Recent onset chest pain will last several weeks beginning over the holidays. She did lose her husband in May of last year. She seems somewhat tearful. Last episode chest pain was 2 weeks ago. Substernal and squeezing. Risk factors for coronary disease include treated hypertension, diabetes and hyperlipidemia as well as family history. Her father died of massive myocardial infarction at age 57. I am going to check an exercise Myoview stress test to risk stratify her.

## 2017-12-12 NOTE — Assessment & Plan Note (Addendum)
History of essential hypertension with blood pressure measured at 124/72. She is on metoprolol and losartan. Continue current meds at current dosing

## 2017-12-12 NOTE — Assessment & Plan Note (Signed)
History hyperlipidemia not on statin therapy because of statin intolerance. Her recent LDL measured by her PCP was 144 with a total cholesterol of 233.

## 2017-12-12 NOTE — Progress Notes (Signed)
12/12/2017 [name]   11-04-1961  [id number]  Primary Physician System, Provider Not In Primary Cardiologist: Lorretta Harp MD Garret Reddish, [place], [place]  HPI:  [name] is a 57 y.o. moderately overweight recently widowed Caucasian female mother of 2, grandmother of her grandchildren's Hospital enforcement died on 2017/04/11. She is retired from working in [employer] system. She was referred by Dr. Christella Noa and Starlyn Skeans  PA-C for evaluation of chest pain. Risk factors include treated hypertension, diabetes or hyperlipidemia. Father did die of a microinfarction age 25. She's never had a heart attack or stroke. Unfortunately her husband diedsuddenly Apr 11, 2017 and she is still recovering from that. She was underlying stress over the holidays and developed chest pain most recent episode of which was 2 weeks ago. The pain lasts for minutes at a time and is associated with shortness of breath.   Current Meds  Medication Sig  . Liraglutide (VICTOZA) 18 MG/3ML SOPN Inject 0.2 mLs (1.2 mg total) into the skin daily.  . Magnesium 250 MG TABS Take 1 tablet by mouth every morning.     Allergies  Allergen Reactions  . Zithromax [Azithromycin] Nausea And Vomiting  . Erythromycin Nausea Only    Stomach cramps    Social History   Socioeconomic History  . Marital status: Married    Spouse name: Not on file  . Number of children: Not on file  . Years of education: Not on file  . Highest education level: Not on file  Social Needs  . Financial resource strain: Not on file  . Food insecurity - worry: Not on file  . Food insecurity - inability: Not on file  . Transportation needs - medical: Not on file  . Transportation needs - non-medical: Not on file  Occupational History  . Not on file  Tobacco Use  . Smoking status: Former Smoker    Last attempt to quit: 01/22/1983    Years since quitting: 34.9  . Smokeless tobacco: Never Used  Substance and  Sexual Activity  . Alcohol use: No  . Drug use: No  . Sexual activity: Yes    Comment: menarche age 28, no HRT, 2 ectopic preg, 1 miscarriage, P2  Other Topics Concern  . 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  Cardiovascular: negative for chest pain, dyspnea on exertion, edema, orthopnea, palpitations, paroxysmal nocturnal dyspnea or shortness of breath Dermatological: negative for rash Respiratory: negative for cough or wheezing Urologic: negative for hematuria Abdominal: negative for nausea, vomiting, diarrhea, bright red blood per rectum, melena, or hematemesis Neurologic: negative for visual changes, syncope, or dizziness All other systems reviewed and are otherwise negative except as noted above.    Blood pressure 124/72, pulse 62, height 5\' 6"  (1.676 m), weight 191 lb (86.6 kg).  General appearance: alert and no distress Neck: no adenopathy, no carotid bruit, no JVD, supple, symmetrical, trachea midline and thyroid not enlarged, symmetric, no tenderness/mass/nodules Lungs: clear to auscultation bilaterally Heart: regular rate and rhythm, S1, S2 normal, no murmur, click, rub or gallop Extremities: extremities normal, atraumatic, no cyanosis or edema Pulses: 2+ and symmetric Skin: Skin color, texture, turgor normal. No rashes or lesions Neurologic: Alert and oriented X 3, normal strength and tone. Normal symmetric reflexes. Normal coordination and gait  EKG sinus rhythm at 62 with  a ST or T-wave changes. I personally reviewed this EKG  ASSESSMENT AND PLAN:   Hypertension History of essential hypertension with blood pressure measured at 124/72. She is on metoprolol and losartan. Continue current meds at current dosing  Hyperlipidemia History hyperlipidemia not on statin therapy because of statin intolerance. Her recent  LDL measured by her PCP was 144 with a total cholesterol of 233.  Chest pain Recent onset chest pain will last several weeks beginning over the holidays. She did lose her husband in May of last year. She seems somewhat tearful. Last episode chest pain was 2 weeks ago. Substernal and squeezing. Risk factors for coronary disease include treated hypertension, diabetes and hyperlipidemia as well as family history. Her father died of massive myocardial infarction at age 78. I am going to check an exercise Myoview stress test to risk stratify her.      Lorretta Harp MD FACP,FACC,FAHA, [facility] 12/12/2017 4:26 PM

## 2017-12-12 NOTE — Patient Instructions (Addendum)
Medication Instructions:   NO CHANGE  Testing/Procedures:  Your physician has requested that you have en exercise stress myoview. For further information please visit [email or web address]. Please follow instruction sheet, as given. DO NOT TAKE METOPROLOL THE NIGHT BEFORE OR THE MORNING OF THE TEST    Follow-Up:  Your physician wants you to follow-up in: [place] will receive a reminder letter in the mail two months in advance. If you don't receive a letter, please call our office to schedule the follow-up appointment.

## 2017-12-14 ENCOUNTER — Telehealth (HOSPITAL_COMMUNITY): Payer: Self-pay

## 2017-12-14 ENCOUNTER — Ambulatory Visit
Admission: RE | Admit: 2017-12-14 | Discharge: 2017-12-14 | Disposition: A | Discharge status: Home / Self Care | Payer: BC Managed Care – PPO | Source: Ambulatory Visit | Attending: Obstetrics and Gynecology | Admitting: Obstetrics and Gynecology

## 2017-12-14 DIAGNOSIS — Z853 Personal history of malignant neoplasm of breast: Secondary | ICD-10-CM

## 2017-12-14 HISTORY — DX: Personal history of irradiation: Z92.3

## 2017-12-14 HISTORY — DX: Personal history of antineoplastic chemotherapy: Z92.21

## 2017-12-14 NOTE — Telephone Encounter (Signed)
Encounter complete. 

## 2017-12-19 ENCOUNTER — Ambulatory Visit (HOSPITAL_COMMUNITY)
Admission: RE | Admit: 2017-12-19 | Discharge: 2017-12-19 | Disposition: A | Discharge status: Home / Self Care | Payer: BC Managed Care – PPO | Source: Ambulatory Visit | Attending: Cardiovascular Disease | Admitting: Cardiovascular Disease

## 2017-12-19 DIAGNOSIS — R072 Precordial pain: Secondary | ICD-10-CM

## 2017-12-19 LAB — MYOCARDIAL PERFUSION IMAGING
CHL CUP NUCLEAR SRS: 1
CHL CUP RESTING HR STRESS: 63 {beats}/min
CHL RATE OF PERCEIVED EXERTION: 18
CSEPEDS: 30 s
CSEPHR: 94 %
CSEPPHR: 155 {beats}/min
Estimated workload: 9.3 METS
Exercise duration (min): 7 min
LVDIAVOL: 78 mL (ref 46–106)
LVSYSVOL: 28 mL
MPHR: 164 {beats}/min
SDS: 0
SSS: 1
TID: 1.03

## 2017-12-19 MED ORDER — TECHNETIUM TC 99M TETROFOSMIN IV KIT
9.8000 | PACK | Freq: Once | INTRAVENOUS | Status: AC | PRN
  Administered 2017-12-19: 9.8 via INTRAVENOUS
  Filled 2017-12-19: qty 10

## 2017-12-19 MED ORDER — TECHNETIUM TC 99M TETROFOSMIN IV KIT
28.9000 | PACK | Freq: Once | INTRAVENOUS | Status: AC | PRN
  Administered 2017-12-19: 28.9 via INTRAVENOUS
  Filled 2017-12-19: qty 29

## 2018-06-13 ENCOUNTER — Other Ambulatory Visit: Payer: Self-pay

## 2018-06-13 DIAGNOSIS — D039 Melanoma in situ, unspecified: Secondary | ICD-10-CM

## 2018-06-13 HISTORY — DX: Melanoma in situ, unspecified: D03.9

## 2018-07-19 ENCOUNTER — Other Ambulatory Visit: Payer: Self-pay | Admitting: Dermatology

## 2018-12-13 ENCOUNTER — Encounter: Payer: Self-pay | Admitting: Cardiovascular Disease

## 2018-12-13 ENCOUNTER — Ambulatory Visit: Payer: BC Managed Care – PPO | Admitting: Cardiovascular Disease

## 2018-12-13 VITALS — BP 132/82 | HR 55 | Ht 66.0 in | Wt 188.0 lb

## 2018-12-13 DIAGNOSIS — I208 Other forms of angina pectoris: Secondary | ICD-10-CM | POA: Diagnosis not present

## 2018-12-13 DIAGNOSIS — E7800 Pure hypercholesterolemia, unspecified: Secondary | ICD-10-CM

## 2018-12-13 DIAGNOSIS — I10 Essential (primary) hypertension: Secondary | ICD-10-CM | POA: Diagnosis not present

## 2018-12-13 NOTE — Progress Notes (Signed)
12/13/2018 [name]   18-Feb-1961  [id number]  Primary Physician Orpah Melter, MD Primary Cardiologist: Lorretta Harp MD FACP, [place], [place], [place]  HPI:  [name] is a 58 y.o.  moderately overweight recently widowed Caucasian female mother of 2 whose husband unfortunately died on 2017/04/21. She is retired from working in [employer] system.  He recently went back to school to be an EKG tech.  She was referred by Dr. Christella Noa and Starlyn Skeans  PA-C for evaluation of chest pain.   I last saw her in the office 12/12/2017.  Risk factors include treated hypertension, diabetes or hyperlipidemia. Father did die of a microinfarction age 17. She's never had a heart attack or stroke. Unfortunately her husband diedsuddenly 04-21-2017 and she is still recovering from that. She was underlying stress over the holidays and developed chest pain .  A Myoview stress test performed 12/19/2017 was entirely normal.  As I saw her a year ago she is remained stable.  She is had no recurrent symptoms.  Her most recent cholesterol however was significantly elevated at 248 with an LDL of 159.  Apparently she is statin intolerant.  Current Meds  Medication Sig  . ALPRAZolam (XANAX) 0.5 MG tablet Take 1 tablet (0.5 mg total) by mouth at bedtime as needed for anxiety.  Marland Kitchen aspirin 81 MG tablet Take 81 mg by mouth daily.    Marland Kitchen BETA CAROTENE PO Take 1 tablet by mouth every morning. TAKE 1000 IU daily  . Blood Glucose Monitoring Suppl (FREESTYLE FREEDOM LITE) W/DEVICE KIT   . Cetirizine HCl (ZYRTEC PO) Take by mouth daily.    . Dulaglutide (TRULICITY) 1.5 AU/6.3FH SOPN Inject into the skin once a week.  Marland Kitchen FREESTYLE LITE test strip   . JANUMET XR 50-1000 MG TB24 TK 2 TS PO Q EVENING WITH FOOD  . lactobacillus acidophilus (BACID) TABS Take 2 tablets by mouth 3 (three) times daily.  . Lancets (FREESTYLE) lancets   . losartan (COZAAR) 50 MG tablet Take 1 tablet (50 mg total) by mouth daily.    . Magnesium 250 MG TABS Take 1 tablet by mouth every morning.  . metoprolol (TOPROL-XL) 50 MG 24 hr tablet TAKE 1 TABLET BY MOUTH TWICE DAILY  . pantoprazole (PROTONIX) 40 MG tablet TAKE 1 TABLET BY MOUTH EVERY DAY  . Vitamin D, Ergocalciferol, (DRISDOL) 50000 UNITS CAPS capsule Every 2 weeks  . [DISCONTINUED] Liraglutide (VICTOZA) 18 MG/3ML SOPN Inject 0.2 mLs (1.2 mg total) into the skin daily.     Allergies  Allergen Reactions  . Zithromax [Azithromycin] Nausea And Vomiting  . Erythromycin Nausea Only    Stomach cramps    Social History   Socioeconomic History  . Marital status: Widowed    Spouse name: Not on file  . Number of children: Not on file  . Years of education: Not on file  . Highest education level: Not on file  Occupational History  . Not on file  Social Needs  . Financial resource strain: Not on file  . Food insecurity:    Worry: Not on file    Inability: Not on file  . Transportation needs:    Medical: Not on file    Non-medical: Not on file  Tobacco Use  . Smoking status: Former Smoker    Last attempt to quit: 01/22/1983    Years since quitting: 35.9  . Smokeless tobacco: Never Used  Substance and Sexual Activity  . Alcohol use:  No  . Drug use: No  . Sexual activity: Yes    Comment: menarche age 12, no HRT, 2 ectopic preg, 1 miscarriage, P2  Lifestyle  . Physical activity:    Days per week: Not on file    Minutes per session: Not on file  . Stress: Not on file  Relationships  . Social connections:    Talks on phone: Not on file    Gets together: Not on file    Attends religious service: Not on file    Active member of club or organization: Not on file    Attends meetings of clubs or organizations: Not on file    Relationship status: Not on file  . Intimate partner violence:    Fear of current or ex partner: Not on file    Emotionally abused: Not on file    Physically abused: Not on file    Forced sexual activity: Not on file  Other Topics  Concern  . 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  Cardiovascular: negative for chest pain, dyspnea on exertion, edema, orthopnea, palpitations, paroxysmal nocturnal dyspnea or shortness of breath Dermatological: negative for rash Respiratory: negative for cough or wheezing Urologic: negative for hematuria Abdominal: negative for nausea, vomiting, diarrhea, bright red blood per rectum, melena, or hematemesis Neurologic: negative for visual changes, syncope, or dizziness All other systems reviewed and are otherwise negative except as noted above.    Blood pressure 132/82, pulse (!) 55, height 5' 6" (1.676 m), weight 188 lb (85.3 kg).  General appearance: alert and no distress Neck: no adenopathy, no carotid bruit, no JVD, supple, symmetrical, trachea midline and thyroid not enlarged, symmetric, no tenderness/mass/nodules Lungs: clear to auscultation bilaterally Heart: regular rate and rhythm, S1, S2 normal, no murmur, click, rub or gallop Extremities: extremities normal, atraumatic, no cyanosis or edema Pulses: 2+ and symmetric Skin: Skin color, texture, turgor normal. No rashes or lesions Neurologic: Alert and oriented X 3, normal strength and tone. Normal symmetric reflexes. Normal coordination and gait  EKG sinus bradycardia 55 without ST or T wave changes. I Personally reviewed this EKG.  ASSESSMENT AND PLAN:   Hypertension History of essential hypertension her blood pressure measured today 132/82.  She is on losartan and metoprolol.  Continue current meds at current dosing.  Hyperlipidemia History of hyperlipidemia intolerant to statin therapy performed 11/29/2018 revealing total cholesterol 248, LDL of 159 and HDL 50.  She may be a candidate for Repatha.  I am going to make her an appointment to see Dr. Hilty in lipid clinic  for further evaluation.  Chest pain History of chest pain a year ago with subsequent Myoview performed 12/19/2017 which was entirely normal.  She is had no recurrent episodes.  She thought this was related to the loss of her husband and May 2018 as well as anxiety over the holidays.      Jonathan J. Berry MD FACP,FACC,FAHA, FSCAI 12/13/2018 12:04 PM 

## 2018-12-13 NOTE — Patient Instructions (Addendum)
Medication Instructions:  NONE If you need a refill on your cardiac medications before your next appointment, please call your pharmacy.   Lab work: NONE If you have labs (blood work) drawn today and your tests are completely normal, you will receive your results only by: Marland Kitchen MyChart Message (if you have MyChart) OR . A paper copy in the mail If you have any lab test that is abnormal or we need to change your treatment, we will call you to review the results.  Testing/Procedures: NONE  Follow-Up: At [facility], you and your health needs are our priority.  As part of our continuing mission to provide you with exceptional heart care, we have created designated Provider Care Teams.  These Care Teams include your primary Cardiologist (physician) and Advanced Practice Providers (APPs -  Physician Assistants and Nurse Practitioners) who all work together to provide you with the care you need, when you need it. . You will need a follow up appointment in 12 months.  Please call our office 2 months in advance to schedule this appointment.  You may see Dr. Gwenlyn Found or one of the following Advanced Practice Providers on your designated Care Team:   . Kerin Ransom, [place] . Almyra Deforest, PA-C . Fabian Sharp, PA-C . Jory Sims, DNP . Rosaria Ferries, PA-C . Roby Lofts, PA-C . Sande Rives, PA-C  Any Other Special Instructions Will Be Listed Below (If Applicable). REFERRAL TO [facility] L[facility]ORD[facility] CLINIC WITH DR. HILTY

## 2018-12-13 NOTE — Assessment & Plan Note (Signed)
History of chest pain a year ago with subsequent Myoview performed 12/19/2017 which was entirely normal.  She is had no recurrent episodes.  She thought this was related to the loss of her husband and May 2018 as well as anxiety over the holidays.

## 2018-12-13 NOTE — Assessment & Plan Note (Signed)
History of essential hypertension her blood pressure measured today 132/82.  She is on losartan and metoprolol.  Continue current meds at current dosing.

## 2018-12-13 NOTE — Assessment & Plan Note (Signed)
History of hyperlipidemia intolerant to statin therapy performed 11/29/2018 revealing total cholesterol 248, LDL of 159 and HDL 50.  She may be a candidate for Repatha.  I am going to make her an appointment to see Dr. Debara Pickett in lipid clinic for further evaluation.

## 2018-12-26 ENCOUNTER — Other Ambulatory Visit: Payer: Self-pay | Admitting: Obstetrics and Gynecology

## 2018-12-26 DIAGNOSIS — Z853 Personal history of malignant neoplasm of breast: Secondary | ICD-10-CM

## 2018-12-31 ENCOUNTER — Encounter: Payer: Self-pay | Admitting: Internal Medicine

## 2018-12-31 ENCOUNTER — Telehealth: Payer: Self-pay | Admitting: Internal Medicine

## 2018-12-31 ENCOUNTER — Ambulatory Visit: Payer: BC Managed Care – PPO | Admitting: Internal Medicine

## 2018-12-31 VITALS — BP 118/72 | HR 60 | Ht 66.0 in | Wt 184.6 lb

## 2018-12-31 DIAGNOSIS — E782 Mixed hyperlipidemia: Secondary | ICD-10-CM

## 2018-12-31 DIAGNOSIS — Z8249 Family history of ischemic heart disease and other diseases of the circulatory system: Secondary | ICD-10-CM

## 2018-12-31 DIAGNOSIS — Z789 Other specified health status: Secondary | ICD-10-CM | POA: Diagnosis not present

## 2018-12-31 DIAGNOSIS — I10 Essential (primary) hypertension: Secondary | ICD-10-CM

## 2018-12-31 NOTE — Patient Instructions (Addendum)
Dr. Debara Pickett recommends Repatha (PCSK9). This is an injectable cholesterol medication. This medication will need prior approval with your insurance company, which we will work on. If the medication is not approved initially, we may need to do an appeal with your insurance. We will keep you updated on this process. This medication can be provided at some local pharmacies or be shipped to you from a specialty pharmacy.    Dr. Debara Pickett recommends that you schedule a follow up visit with him the in the [place] in 4 months. Please have fasting blood work about 1 week prior to this visit and he will review the blood work results with you at your appointment.

## 2018-12-31 NOTE — Progress Notes (Signed)
LIPID CLINIC CONSULT NOTE  Chief Complaint:  Manage dyslipidemia  Primary Care Physician: Orpah Melter, MD  Primary Cardiologist:  No primary care provider on file.  HPI:  [name] is a 58 y.o. female who is being seen today for the evaluation of dyslipidemia at the request of Orpah Melter, MD. This is a 58 year old female patient kindly referred by Dr. Alvester Chou for evaluation of dyslipidemia.  Her past medical history significant for diabetes, hypertension, breast cancer for which she is a survivor, as well as fatty liver disease, obesity and palpitations.  There is a strong family history of coronary artery disease including her father who had early onset heart disease in his 100s.  Unfortunately she has been intolerant to statins.  She is previously tried atorvastatin, rosuvastatin, pravastatin and other medications but had significant cramping and discomfort in her legs on the medications which resolved after discontinuing of them.  Most recently her lipid profile showed a total cholesterol 248, LDL of 159, HDL of 50 and triglycerides of 200.  Her hemoglobin A1c was 6.4.  She was apparently supposed to be on Trulicity however says she is not using the injections because she felt unwell after using the medication.  She has not yet informed her primary care provider of this.  From a cardiac standpoint, she is asymptomatic, denies any chest pain or worsening shortness of breath.  She did have a Myoview stress test which was low risk for ischemia and showed normal LV function in February 2019.  PMHx:  Past Medical History:  Diagnosis Date  . Breast cancer ([place]) 06/11/12   biopsy, ER/PR +  . Contact lens/glasses fitting   . Diabetes mellitus   . Diarrhea   . Ectopic pregnancy    x1  . Fatigue   . Fatty liver    History of Ftty Liver  . Hives    Etiology questionable  . Hyperlipidemia   . Hypertension   . Insomnia   . Lower extremity edema    Resolved  . Obesity   .  Palpitations   . Personal history of chemotherapy   . Personal history of radiation therapy   . Snores     Past Surgical History:  Procedure Laterality Date  . ABDOMINAL HYSTERECTOMY  2008   lt ovary out, endometriosis, fibroids  . BREAST BIOPSY    . BREAST LUMPECTOMY Right    2013  . BREAST SURGERY  06/21/12   Rt br lumpectomy  . DILATION AND CURETTAGE OF UTERUS    . ECTOPIC PREGNANCY SURGERY  2003   x 2  . TUBAL LIGATION     At age 70  . TUBAL LIGATION     reversal    FAMHx:  Family History  Problem Relation Age of Onset  . Heart failure Mother   . Heart attack Mother   . Cancer Mother   . Heart attack Father   . Heart failure Father   . Cancer Brother   . Breast cancer Other     SOCHx:   reports that she quit smoking about 35 years ago. She has never used smokeless tobacco. She reports that she does not drink alcohol or use drugs.  ALLERGIES:  Allergies  Allergen Reactions  . Zithromax [Azithromycin] Nausea And Vomiting  . Erythromycin Nausea Only    Stomach cramps  . Atorvastatin Other (See Comments)  . Erythromycin Base Nausea And Vomiting  . Rosuvastatin Other (See Comments)    ROS: Pertinent items noted  in HPI and remainder of comprehensive ROS otherwise negative.  HOME MEDS: Current Outpatient Medications on File Prior to Visit  Medication Sig Dispense Refill  . ALPRAZolam (XANAX) 0.5 MG tablet Take 1 tablet (0.5 mg total) by mouth at bedtime as needed for anxiety.    Marland Kitchen aspirin 81 MG tablet Take 81 mg by mouth daily.      Marland Kitchen BETA CAROTENE PO Take 1 tablet by mouth every morning. TAKE 1000 IU daily    . Blood Glucose Monitoring Suppl (FREESTYLE FREEDOM LITE) W/DEVICE KIT     . Cetirizine HCl (ZYRTEC PO) Take by mouth daily.      . Dulaglutide (TRULICITY) 1.5 YI/9.4WN SOPN Inject into the skin once a week.    . Flaxseed, Linseed, (FLAXSEED OIL PO) Take by mouth.    Marland Kitchen FREESTYLE LITE test strip     . JANUMET XR 50-1000 MG TB24 TK 2 TS PO Q EVENING WITH  FOOD  5  . lactobacillus acidophilus (BACID) TABS Take 2 tablets by mouth 3 (three) times daily.    . Lancets (FREESTYLE) lancets     . losartan (COZAAR) 50 MG tablet Take 1 tablet (50 mg total) by mouth daily. 90 tablet 3  . Magnesium 250 MG TABS Take 1 tablet by mouth every morning.    . metoprolol (TOPROL-XL) 50 MG 24 hr tablet TAKE 1 TABLET BY MOUTH TWICE DAILY 60 tablet 0  . pantoprazole (PROTONIX) 40 MG tablet TAKE 1 TABLET BY MOUTH EVERY DAY 30 tablet 0  . Vitamin D, Ergocalciferol, (DRISDOL) 50000 UNITS CAPS capsule Every 2 weeks  2   No current facility-administered medications on file prior to visit.     LABS/IMAGING: No results found for this or any previous visit (from the past 48 hour(s)). No results found.  LIPID PANEL:    Component Value Date/Time   CHOL 163 04/18/2011 0609   TRIG 127 04/18/2011 0609   HDL 53 04/18/2011 0609   CHOLHDL 3.1 04/18/2011 0609   VLDL 25 04/18/2011 0609   LDLCALC  04/18/2011 0609    85        Total Cholesterol/HDL:CHD Risk Coronary Heart Disease Risk Table                     Men   Women  1/2 Average Risk   3.4   3.3  Average Risk       5.0   4.4  2 X Average Risk   9.6   7.1  3 X Average Risk  23.4   11.0        Use the calculated Patient Ratio above and the CHD Risk Table to determine the patient's CHD Risk.        ATP III CLASSIFICATION (LDL):  <100     mg/dL   Optimal  100-129  mg/dL   Near or Above                    Optimal  130-159  mg/dL   Borderline  160-189  mg/dL   High  >190     mg/dL   Very High    WEIGHTS: Wt Readings from Last 3 Encounters:  12/31/18 184 lb 9.6 oz (83.7 kg)  12/13/18 188 lb (85.3 kg)  12/19/17 191 lb (86.6 kg)    VITALS: BP 118/72   Pulse 60   Ht _0  (1.676 m)   Wt 184 lb 9.6 oz (83.7 kg)   SpO2 97%  BMI 29.80 kg/m   EXAM: General appearance: alert and no distress Neck: no carotid bruit, no JVD and thyroid not enlarged, symmetric, no tenderness/mass/nodules Lungs: clear to  auscultation bilaterally Heart: regular rate and rhythm, S1, S2 normal, no murmur, click, rub or gallop Abdomen: soft, non-tender; bowel sounds normal; no masses,  no organomegaly Extremities: extremities normal, atraumatic, no cyanosis or edema Pulses: 2+ and symmetric Skin: Skin color, texture, turgor normal. No rashes or lesions Neurologic: Grossly normal Psych: Pleasant  EKG: Deferred  ASSESSMENT: 1. Mixed dyslipidemia 2. Strong family history of premature coronary artery disease 3. Type 2 diabetes 4. Hypertension  PLAN: 1.   Mrs. Cornforth has mixed lipidemia with a strong family history of premature coronary disease.  She also has type 2 diabetes and hypertension and would be considered at least intermediate risk.  Her ASCVD ten-year risk score is 7%.  Her lifetime ASCVD risk score is 50%.  Based on this, I think she is a good candidate for additional risk factor modification and would recommend Repatha.  We will demonstrate the use of the Repatha sure click injector today and start her on the 140 mg every 2-week dosing.  Plan follow-up with me in about 4 months with a lipid profile prior to that visit.  Thanks again for the kind referral.  Pixie Casino, MD, FACC, [place] Director of the Advanced Lipid Disorders &  Cardiovascular Risk Reduction Clinic Diplomate of the American Board of Clinical Lipidology Attending Cardiologist  Direct Dial: [phone]  Fax: [phone]  Website:  [email or web address] 12/31/2018, 10:12 AM

## 2018-12-31 NOTE — Telephone Encounter (Signed)
We discussed a calcium score, but since I believe it will not affect her management at this point, will hold off on it. If she is denied Repatha, then we could consider the calcium score to evaluate for evidence of coronary disease and strengthen our case with the insurance company for approval.  Dr. Lemmie Evens

## 2018-12-31 NOTE — Telephone Encounter (Signed)
New Message:    Patient calling because she is confused about where to go and when to get her lab. Please call patient.

## 2018-12-31 NOTE — Telephone Encounter (Signed)
SPOKE TO PATIENT - INFORMATION GIVEN PER DR HILTY .  PATIENT  VERBALIZED UNDERSTANDING

## 2018-12-31 NOTE — Telephone Encounter (Signed)
Patient wanted to know if she was suppose to get labs now ( or was Dr Debara Pickett using labs) from Jan 2020 or wait until June prior to office visit.  RN informed patient labs -lipid should be done week before June appointment,  Also, patient stated Dr Debara Pickett had mention about having some type of CT test during the visit but she was not sure if wanted her to do it now or later, no mention on AVS.  PATIENT aware will defer to Dr Debara Pickett and contact her back.

## 2019-01-06 ENCOUNTER — Telehealth: Payer: Self-pay | Admitting: Internal Medicine

## 2019-01-06 ENCOUNTER — Other Ambulatory Visit: Payer: Self-pay | Admitting: Obstetrics and Gynecology

## 2019-01-06 ENCOUNTER — Telehealth: Payer: Self-pay

## 2019-01-06 ENCOUNTER — Ambulatory Visit
Admission: RE | Admit: 2019-01-06 | Discharge: 2019-01-06 | Disposition: A | Discharge status: Home / Self Care | Payer: BC Managed Care – PPO | Source: Ambulatory Visit | Attending: Obstetrics and Gynecology | Admitting: Obstetrics and Gynecology

## 2019-01-06 DIAGNOSIS — Z1231 Encounter for screening mammogram for malignant neoplasm of breast: Secondary | ICD-10-CM

## 2019-01-06 DIAGNOSIS — Z853 Personal history of malignant neoplasm of breast: Secondary | ICD-10-CM

## 2019-01-06 MED ORDER — EVOLOCUMAB 140 MG/ML ~~LOC~~ SOAJ: 140.0000 mg | SUBCUTANEOUS | 6 refills | Status: DC

## 2019-01-06 NOTE — Telephone Encounter (Signed)
Returned a call to the pt regarding the repatha pa being approved and to call us back with any questions

## 2019-01-06 NOTE — Addendum Note (Signed)
Addended by: Allean Found on: 01/06/2019 04:22 PM   Modules accepted: Orders

## 2019-01-06 NOTE — Telephone Encounter (Signed)
Follow up   Patient is returning your call. Please contact the patient.

## 2019-01-06 NOTE — Telephone Encounter (Signed)
Called and left a detailed msg for the pt regarding their repatha pa was approved and sent rx to pharmacy and to call us back if the copay is unaffordable to them

## 2019-01-06 NOTE — Telephone Encounter (Signed)
No message needed °

## 2019-02-12 ENCOUNTER — Ambulatory Visit: Payer: BC Managed Care – PPO | Admitting: Internal Medicine

## 2019-02-14 NOTE — Telephone Encounter (Signed)
Dr. Debara Pickett  Do you want to try her on pitavastatin or a low dose rosuvastatin?

## 2019-02-17 MED ORDER — PITAVASTATIN CALCIUM 4 MG PO TABS: 4.0000 mg | ORAL_TABLET | Freq: Every day | ORAL | 11 refills | Status: DC

## 2019-02-17 NOTE — Telephone Encounter (Signed)
Sorry to send this back to you, but please forward to whichever nurse is assisting you - I'm in drive thru coumadin clinic all week

## 2019-02-17 NOTE — Telephone Encounter (Signed)
Pt update with Dr. Lysbeth Penner recommendation to start pitavastatin 4 mg daily. Pt voiced understanding and new order placed.

## 2019-02-18 ENCOUNTER — Other Ambulatory Visit: Payer: Self-pay

## 2019-02-19 ENCOUNTER — Telehealth: Payer: Self-pay

## 2019-02-19 NOTE — Telephone Encounter (Signed)
Opened in error

## 2019-02-20 ENCOUNTER — Other Ambulatory Visit: Payer: Self-pay

## 2019-02-21 ENCOUNTER — Other Ambulatory Visit: Payer: Self-pay | Admitting: Internal Medicine

## 2019-02-21 NOTE — Progress Notes (Signed)
Opened in error

## 2019-02-28 ENCOUNTER — Telehealth: Payer: Self-pay | Admitting: *Deleted

## 2019-02-28 NOTE — Telephone Encounter (Signed)
Pharmacist notified PA received for Livalo. He states that although they approved it when he ran it through her copay is still $232.00/month.  Dr Debara Pickett will be notified. [facility] [employer] auth # [id number]. Valid 02/28/19 to 02/28/20

## 2019-03-05 NOTE — Telephone Encounter (Signed)
Thanks for notifying me - does that high co-pay mean that she is not going to take it?  Dr Lemmie Evens

## 2019-03-06 NOTE — Telephone Encounter (Signed)
I'm not sure. I didn't speak with the patient. I just wanted you to know in case you want to give less expensive RX/

## 2019-03-17 DIAGNOSIS — E782 Mixed hyperlipidemia: Secondary | ICD-10-CM

## 2019-03-18 MED ORDER — EZETIMIBE-SIMVASTATIN 10-20 MG PO TABS: 1.0000 | ORAL_TABLET | Freq: Every day | ORAL | 11 refills | Status: DC

## 2019-03-18 NOTE — Telephone Encounter (Signed)
Rx sent to pharmacy Lab order mailed

## 2019-03-18 NOTE — Telephone Encounter (Signed)
Routed to MD to advise on medications

## 2019-04-01 ENCOUNTER — Telehealth: Payer: Self-pay | Admitting: Internal Medicine

## 2019-04-01 NOTE — Telephone Encounter (Signed)
Error

## 2019-05-06 ENCOUNTER — Ambulatory Visit: Payer: BC Managed Care – PPO | Admitting: Internal Medicine

## 2019-07-09 ENCOUNTER — Ambulatory Visit: Payer: BC Managed Care – PPO | Admitting: Internal Medicine

## 2019-07-09 ENCOUNTER — Encounter: Payer: Self-pay | Admitting: Internal Medicine

## 2019-07-09 ENCOUNTER — Other Ambulatory Visit: Payer: Self-pay

## 2019-07-09 VITALS — BP 114/88 | HR 62 | Temp 97.7°F | Ht 66.0 in | Wt 188.0 lb

## 2019-07-09 DIAGNOSIS — E782 Mixed hyperlipidemia: Secondary | ICD-10-CM

## 2019-07-09 DIAGNOSIS — Z789 Other specified health status: Secondary | ICD-10-CM | POA: Diagnosis not present

## 2019-07-09 DIAGNOSIS — I10 Essential (primary) hypertension: Secondary | ICD-10-CM | POA: Diagnosis not present

## 2019-07-09 NOTE — Patient Instructions (Signed)
Medication Instructions:  Your physician recommends that you continue on your current medications as directed. Please refer to the Current Medication list given to you today.  If you need a refill on your cardiac medications before your next appointment, please call your pharmacy.   Follow-Up: as needed in the lipid clinic with Dr. Debara Pickett

## 2019-07-09 NOTE — Progress Notes (Signed)
LIPID CLINIC CONSULT NOTE  Chief Complaint:  Manage dyslipidemia  Primary Care Physician: Orpah Melter, MD  Primary Cardiologist:  No primary care provider on file.  HPI:  [name] is a 58 y.o. female who is being seen today for the evaluation of dyslipidemia at the request of Orpah Melter, MD. This is a 58 year old female patient kindly referred by Dr. Gwenlyn Found for evaluation of dyslipidemia.  Her past medical history significant for diabetes, hypertension, breast cancer for which she is a survivor, as well as fatty liver disease, obesity and palpitations.  There is a strong family history of coronary artery disease including her father who had early onset heart disease in his 59s.  Unfortunately she has been intolerant to statins.  She is previously tried atorvastatin, rosuvastatin, pravastatin and other medications but had significant cramping and discomfort in her legs on the medications which resolved after discontinuing of them.  Most recently her lipid profile showed a total cholesterol 248, LDL of 159, HDL of 50 and triglycerides of 200.  Her hemoglobin A1c was 6.4.  She was apparently supposed to be on Trulicity however says she is not using the injections because she felt unwell after using the medication.  She has not yet informed her primary care provider of this.  From a cardiac standpoint, she is asymptomatic, denies any chest pain or worsening shortness of breath.  She did have a Myoview stress test which was low risk for ischemia and showed normal LV function in February 2019.  07/09/2019  [name] returns today for follow-up.  Overall she seems to be doing well.  Although we investigated a number of different options, ultimately she decided to try a statin again.  She was placed on simvastatin/ezetimibe 20/10 mg daily.  She is also made significant dietary changes including decreasing sugar and lost weight.  Her recent lipid profile showed total cholesterol 130, HDL  of 40 LDL 56 and triglycerides 127.  LDL has decreased from 159.  Overall she seems to be tolerating the statin/Zetia combination.  She reports a decrease in her hemoglobin A1c and now is only on metformin.  PMHx:  Past Medical History:  Diagnosis Date   Breast cancer ([place]) 06/11/12   biopsy, ER/PR +   Contact lens/glasses fitting    Diabetes mellitus    Diarrhea    Ectopic pregnancy    x1   Fatigue    Fatty liver    History of Ftty Liver   Hives    Etiology questionable   Hyperlipidemia    Hypertension    Insomnia    Lower extremity edema    Resolved   Obesity    Palpitations    Personal history of chemotherapy    Personal history of radiation therapy    Snores     Past Surgical History:  Procedure Laterality Date   ABDOMINAL HYSTERECTOMY  2008   lt ovary out, endometriosis, fibroids   BREAST BIOPSY     BREAST LUMPECTOMY Right    2013   BREAST SURGERY  06/21/12   Rt br lumpectomy   DILATION AND CURETTAGE OF UTERUS     ECTOPIC PREGNANCY SURGERY  2003   x 2   TUBAL LIGATION     At age 57   TUBAL LIGATION     reversal    FAMHx:  Family History  Problem Relation Age of Onset   Heart failure Mother    Heart attack Mother    Cancer Mother  Heart attack Father    Heart failure Father    Cancer Brother    Breast cancer Other     SOCHx:   reports that she quit smoking about 36 years ago. She has never used smokeless tobacco. She reports that she does not drink alcohol or use drugs.  ALLERGIES:  Allergies  Allergen Reactions   Zithromax [Azithromycin] Nausea And Vomiting   Erythromycin Nausea Only    Stomach cramps   Atorvastatin Other (See Comments)   Erythromycin Base Nausea And Vomiting   Rosuvastatin Other (See Comments)    ROS: Pertinent items noted in HPI and remainder of comprehensive ROS otherwise negative.  HOME MEDS: Current Outpatient Medications on File Prior to Visit  Medication Sig Dispense Refill     ALPRAZolam (XANAX) 0.5 MG tablet Take 1 tablet (0.5 mg total) by mouth at bedtime as needed for anxiety.     aspirin 81 MG tablet Take 81 mg by mouth daily.       BETA CAROTENE PO Take 1 tablet by mouth every morning. TAKE 1000 IU daily     Blood Glucose Monitoring Suppl (FREESTYLE FREEDOM LITE) W/DEVICE KIT      Cetirizine HCl (ZYRTEC PO) Take by mouth daily.       ezetimibe-simvastatin (VYTORIN) 10-20 MG tablet Take 1 tablet by mouth daily. 30 tablet 11   Flaxseed, Linseed, (FLAXSEED OIL PO) Take by mouth.     FREESTYLE LITE test strip      lactobacillus acidophilus (BACID) TABS Take 2 tablets by mouth 3 (three) times daily.     Lancets (FREESTYLE) lancets      losartan (COZAAR) 50 MG tablet Take 1 tablet (50 mg total) by mouth daily. 90 tablet 3   Magnesium 250 MG TABS Take 1 tablet by mouth every morning.     metFORMIN (GLUCOPHAGE-XR) 500 MG 24 hr tablet Take 2 tablets by mouth 2 (two) times daily.     metoprolol (TOPROL-XL) 50 MG 24 hr tablet TAKE 1 TABLET BY MOUTH TWICE DAILY 60 tablet 0   pantoprazole (PROTONIX) 40 MG tablet TAKE 1 TABLET BY MOUTH EVERY DAY 30 tablet 0   TURMERIC PO Take 1 capsule by mouth daily.     Vitamin D, Ergocalciferol, (DRISDOL) 50000 UNITS CAPS capsule Every 2 weeks  2   No current facility-administered medications on file prior to visit.     LABS/IMAGING: No results found for this or any previous visit (from the past 48 hour(s)). No results found.  LIPID PANEL:    Component Value Date/Time   CHOL 163 04/18/2011 0609   TRIG 127 04/18/2011 0609   HDL 53 04/18/2011 0609   CHOLHDL 3.1 04/18/2011 0609   VLDL 25 04/18/2011 0609   LDLCALC  04/18/2011 0609    85        Total Cholesterol/HDL:CHD Risk Coronary Heart Disease Risk Table                     Men   Women  1/2 Average Risk   3.4   3.3  Average Risk       5.0   4.4  2 X Average Risk   9.6   7.1  3 X Average Risk  23.4   11.0        Use the calculated Patient Ratio above  and the CHD Risk Table to determine the patient's CHD Risk.        ATP III CLASSIFICATION (LDL):  <100  mg/dL   Optimal  100-129  mg/dL   Near or Above                    Optimal  130-159  mg/dL   Borderline  160-189  mg/dL   High  >190     mg/dL   Very High    WEIGHTS: Wt Readings from Last 3 Encounters:  07/09/19 188 lb (85.3 kg)  12/31/18 184 lb 9.6 oz (83.7 kg)  12/13/18 188 lb (85.3 kg)    VITALS: BP 114/88    Pulse 62    Temp 97.7 F (36.5 C)    Ht _0  (1.676 m)    Wt 188 lb (85.3 kg)    SpO2 99%    BMI 30.34 kg/m   EXAM: Deferred  EKG: Deferred  ASSESSMENT: 1. Mixed dyslipidemia 2. Strong family history of premature coronary artery disease 3. Type 2 diabetes 4. Hypertension  PLAN: 1.   [name] has had significant improvement in her lipid profile on simvastatin/ezetimibe.  She seems to be tolerating this well without side effects.  She is also made a dietary changes and decrease sugar in her diet.  Her diabetes has improved and she is now only on metformin.  Blood pressure is at goal today.  Overall a marked improvement in her lipid profile.  She should continue on this and can follow-up primarily with Dr. Gwenlyn Found.  I am happy to see her back as needed.  Pixie Casino, MD, [facility], [place] Director of the Advanced Lipid Disorders &  Cardiovascular Risk Reduction Clinic Diplomate of the American Board of Clinical Lipidology Attending Cardiologist  Direct Dial: [phone]   Fax: [phone]  Website:  [email or web address] 07/09/2019, 3:23 PM

## 2019-12-05 ENCOUNTER — Other Ambulatory Visit: Payer: Self-pay

## 2019-12-05 ENCOUNTER — Ambulatory Visit: Payer: BC Managed Care – PPO | Admitting: Cardiovascular Disease

## 2019-12-05 ENCOUNTER — Encounter: Payer: Self-pay | Admitting: Cardiovascular Disease

## 2019-12-05 VITALS — BP 121/63 | HR 60 | Temp 96.9°F | Ht 66.0 in | Wt 188.8 lb

## 2019-12-05 DIAGNOSIS — I208 Other forms of angina pectoris: Secondary | ICD-10-CM

## 2019-12-05 DIAGNOSIS — E782 Mixed hyperlipidemia: Secondary | ICD-10-CM | POA: Diagnosis not present

## 2019-12-05 DIAGNOSIS — Z8249 Family history of ischemic heart disease and other diseases of the circulatory system: Secondary | ICD-10-CM

## 2019-12-05 DIAGNOSIS — I10 Essential (primary) hypertension: Secondary | ICD-10-CM

## 2019-12-05 NOTE — Assessment & Plan Note (Signed)
History of atypical chest pain in the past which is no longer an issue.  A Myoview stress test performed 12/19/2017 was entirely normal.  This is probably related to stress and anxiety over the loss of her husband.

## 2019-12-05 NOTE — Patient Instructions (Signed)
Medication Instructions:  Your physician recommends that you continue on your current medications as directed. Please refer to the Current Medication list given to you today.  If you need a refill on your cardiac medications before your next appointment, please call your pharmacy.   Lab work: NONE  Testing/Procedures: NONE  Follow-Up: At [facility], you and your health needs are our priority.  As part of our continuing mission to provide you with exceptional heart care, we have created designated Provider Care Teams.  These Care Teams include your primary Cardiologist (physician) and Advanced Practice Providers (APPs -  Physician Assistants and Nurse Practitioners) who all work together to provide you with the care you need, when you need it. You may see Dr. Berry or one of the following Advanced Practice Providers on your designated Care Team:    Luke Kilroy, PA-C  Callie Goodrich, PA-C  Jesse Cleaver, FNP  Your physician wants you to follow-up in: 1 year with Dr. Berry       

## 2019-12-05 NOTE — Assessment & Plan Note (Signed)
history of essential hypertension with blood pressure measured today at 121/63.  She is on losartan and metoprolol.

## 2019-12-05 NOTE — Assessment & Plan Note (Signed)
History of hyperlipidemia on Zetia and simvastatin with lipid profile performed 06/13/2019 revealing total cholesterol 130, LDL 56 and HDL 48.

## 2019-12-05 NOTE — Progress Notes (Signed)
12/05/2019 [name]   Feb 03, 1961  [id number]  Primary Physician Orpah Melter, MD Primary Cardiologist: Lorretta Harp MD FACP, [place], [place], [place]  HPI:  [name] is a 59 y.o.  moderately overweight recently widowed Caucasian female mother of 2 whose husband unfortunately died on Apr 13, 2017. 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 She was referred by Dr. Christella Noa and Starlyn Skeans PA-C for evaluation of chest pain.  I last saw her in the office 12/13/2018.  Risk factors include treated hypertension, diabetes or hyperlipidemia.  I did send her to see Dr. Debara Pickett in the lipid clinic who last saw her 07/09/2019.  Father did die of a microinfarction age 11. She's never had a heart attack or stroke. Unfortunately her husband diedsuddenly 2017-04-13 and she is still recovering from that. She was underlying stress over the holidays and developed chest pain .  A Myoview stress test performed 12/19/2017 was entirely normal.  Since I saw her a year ago she is remained stable.  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  Her lipid profile is excellent.  She denies chest pain or shortness of breath.    Current Meds  Medication Sig  . aspirin 81 MG tablet Take 81 mg by mouth daily.    Marland Kitchen BETA CAROTENE PO Take 1 tablet by mouth every morning. TAKE 1000 IU daily  . Blood Glucose Monitoring Suppl (FREESTYLE FREEDOM LITE) W/DEVICE KIT   . Cetirizine HCl (ZYRTEC PO) Take by mouth daily.    Marland Kitchen ezetimibe-simvastatin (VYTORIN) 10-20 MG tablet Take 1 tablet by mouth daily.  . Flaxseed, Linseed, (FLAXSEED OIL PO) Take by mouth.  Marland Kitchen FREESTYLE LITE test strip   . lactobacillus acidophilus (BACID) TABS Take 2 tablets by mouth 3 (three) times daily.  . Lancets (FREESTYLE) lancets   . losartan (COZAAR) 50 MG tablet Take 1  tablet (50 mg total) by mouth daily.  . Magnesium 250 MG TABS Take 1 tablet by mouth every morning.  . metFORMIN (GLUCOPHAGE-XR) 500 MG 24 hr tablet Take 2 tablets by mouth 2 (two) times daily.  . metoprolol (TOPROL-XL) 50 MG 24 hr tablet TAKE 1 TABLET BY MOUTH TWICE DAILY  . pantoprazole (PROTONIX) 40 MG tablet TAKE 1 TABLET BY MOUTH EVERY DAY  . TURMERIC PO Take 1 capsule by mouth daily.  . Vitamin D, Ergocalciferol, (DRISDOL) 50000 UNITS CAPS capsule Every 2 weeks  . [DISCONTINUED] ALPRAZolam (XANAX) 0.5 MG tablet Take 1 tablet (0.5 mg total) by mouth at bedtime as needed for anxiety.     Allergies  Allergen Reactions  . Zithromax [Azithromycin] Nausea And Vomiting  . Erythromycin Nausea Only    Stomach cramps  . Atorvastatin Other (See Comments)  . Erythromycin Base Nausea And Vomiting  . Rosuvastatin Other (See Comments)    Social History   Socioeconomic History  . Marital status: Widowed    Spouse name: Not on file  . Number of children: Not on file  . Years of education: Not on file  . Highest education level: Not on file  Occupational History  . Not on file  Tobacco Use  . Smoking status: Former Smoker    Quit date: 01/22/1983    Years since quitting: 36.8  . Smokeless tobacco: Never Used  Substance and Sexual  Activity  . Alcohol use: No  . Drug use: No  . Sexual activity: Yes    Comment: menarche age 84, no HRT, 2 ectopic preg, 1 miscarriage, P2  Other Topics Concern  . 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 Difficulty of Paying Living Expenses: Not on file  Food Insecurity:   . Worried About Charity fundraiser in the Last Year: Not on file  . Ran Out of Food in the Last Year: Not on file  Transportation Needs:   . Lack of Transportation (Medical): Not on file  . Lack of Transportation (Non-Medical): Not on file  Physical  Activity:   . Days of Exercise per Week: Not on file  . Minutes of Exercise per Session: Not on file  Stress:   . Feeling of Stress : Not on file  Social Connections:   . Frequency of Communication with Friends and Family: Not on file  . Frequency of Social Gatherings with Friends and Family: Not on file  . Attends Religious Services: Not on file  . Active Member of Clubs or Organizations: Not on file  . Attends Archivist Meetings: Not on file  . Marital Status: Not on file  Intimate Partner Violence:   . Fear of Current or Ex-Partner: Not on file  . Emotionally Abused: Not on file  . Physically Abused: Not on file  . Sexually Abused: Not on file     Review of Systems: General: negative for chills, fever, night sweats or weight changes.  Cardiovascular: negative for chest pain, dyspnea on exertion, edema, orthopnea, palpitations, paroxysmal nocturnal dyspnea or shortness of breath Dermatological: negative for rash Respiratory: negative for cough or wheezing Urologic: negative for hematuria Abdominal: negative for nausea, vomiting, diarrhea, bright red blood per rectum, melena, or hematemesis Neurologic: negative for visual changes, syncope, or dizziness All other systems reviewed and are otherwise negative except as noted above.    Blood pressure 121/63, pulse 60, temperature (!) 96.9 F (36.1 C), height '5\' 6"'  (1.676 m), weight 188 lb 12.8 oz (85.6 kg), SpO2 98 %.  General appearance: alert and no distress Neck: no adenopathy, no carotid bruit, no JVD, supple, symmetrical, trachea midline and thyroid not enlarged, symmetric, no tenderness/mass/nodules Lungs: clear to auscultation bilaterally Heart: regular rate and rhythm, S1, S2 normal, no murmur, click, rub or gallop Extremities: extremities normal, atraumatic, no cyanosis or edema Pulses: 2+ and symmetric Skin: Skin color, texture, turgor normal. No rashes or lesions Neurologic: Alert and oriented X 3, normal  strength and tone. Normal symmetric reflexes. Normal coordination and gait  EKG sinus bradycardia 59 without ST or T wave changes.  I personally reviewed this EKG.  ASSESSMENT AND PLAN:   Essential hypertension  history of essential hypertension with blood pressure measured today at 121/63.  She is on losartan and metoprolol.   Hyperlipidemia History of hyperlipidemia on Zetia and simvastatin with lipid profile performed 06/13/2019 revealing total cholesterol 130, LDL 56 and HDL 48.  Chest pain History of atypical chest pain in the past which is no longer an issue.  A Myoview stress test performed 12/19/2017 was entirely normal.  This is probably related to stress and anxiety over the loss of her husband.      Lorretta Harp MD FACP,FACC,FAHA, [facility] 12/05/2019 3:37 PM

## 2019-12-08 ENCOUNTER — Other Ambulatory Visit: Payer: Self-pay

## 2019-12-16 ENCOUNTER — Ambulatory Visit: Payer: BC Managed Care – PPO | Admitting: Cardiovascular Disease

## 2020-01-12 ENCOUNTER — Other Ambulatory Visit: Payer: Self-pay | Admitting: Obstetrics and Gynecology

## 2020-01-12 DIAGNOSIS — Z1231 Encounter for screening mammogram for malignant neoplasm of breast: Secondary | ICD-10-CM

## 2020-02-11 ENCOUNTER — Ambulatory Visit
Admission: RE | Admit: 2020-02-11 | Discharge: 2020-02-11 | Disposition: A | Discharge status: Home / Self Care | Payer: BC Managed Care – PPO | Source: Ambulatory Visit | Attending: Obstetrics and Gynecology | Admitting: Obstetrics and Gynecology

## 2020-02-11 ENCOUNTER — Other Ambulatory Visit: Payer: Self-pay

## 2020-02-11 DIAGNOSIS — Z1231 Encounter for screening mammogram for malignant neoplasm of breast: Secondary | ICD-10-CM

## 2020-03-11 ENCOUNTER — Other Ambulatory Visit: Payer: Self-pay | Admitting: Internal Medicine

## 2020-08-28 ENCOUNTER — Other Ambulatory Visit: Payer: Self-pay | Admitting: Primary Care

## 2020-08-28 DIAGNOSIS — U071 COVID-19: Secondary | ICD-10-CM

## 2020-08-28 NOTE — Progress Notes (Signed)
I connected by phone with [name] on 08/28/2020 at 12:36 PM to discuss the potential use of a new treatment for mild to moderate COVID-19 viral infection in non-hospitalized patients.  This patient is a 59 y.o. female that meets the FDA criteria for Emergency Use Authorization of COVID monoclonal antibody casirivimab/imdevimab or bamlanivimab/eteseviamb.  Has a (+) direct SARS-CoV-2 viral test result  Has mild or moderate COVID-19   Is NOT hospitalized due to COVID-19  Is within 10 days of symptom onset  Has at least one of the high risk factor(s) for progression to severe COVID-19 and/or hospitalization as defined in EUA.  Specific high risk criteria : BMI > 25, Diabetes and Cardiovascular disease or hypertension   I have spoken and communicated the following to the patient or parent/caregiver regarding COVID monoclonal antibody treatment:  1. FDA has authorized the emergency use for the treatment of mild to moderate COVID-19 in adults and pediatric patients with positive results of direct SARS-CoV-2 viral testing who are 75 years of age and older weighing at least 40 kg, and who are at high risk for progressing to severe COVID-19 and/or hospitalization.  2. The significant known and potential risks and benefits of COVID monoclonal antibody, and the extent to which such potential risks and benefits are unknown.  3. Information on available alternative treatments and the risks and benefits of those alternatives, including clinical trials.  4. Patients treated with COVID monoclonal antibody should continue to self-isolate and use infection control measures (e.g., wear mask, isolate, social distance, avoid sharing personal items, clean and disinfect "high touch" surfaces, and frequent handwashing) according to CDC guidelines.   5. The patient or parent/caregiver has the option to accept or refuse COVID monoclonal antibody treatment.  After reviewing this information with the  patient, the patient has agreed to receive one of the available covid 19 monoclonal antibodies and will be provided an appropriate fact sheet prior to infusion. Pleas Koch, NP 08/28/2020 12:36 PM

## 2020-08-29 ENCOUNTER — Ambulatory Visit (HOSPITAL_COMMUNITY)
Admission: RE | Admit: 2020-08-29 | Discharge: 2020-08-29 | Disposition: A | Discharge status: Home / Self Care | Payer: BC Managed Care – PPO | Source: Ambulatory Visit | Attending: Pulmonary Disease | Admitting: Pulmonary Disease

## 2020-08-29 DIAGNOSIS — U071 COVID-19: Secondary | ICD-10-CM | POA: Insufficient documentation

## 2020-08-29 MED ORDER — DIPHENHYDRAMINE HCL 50 MG/ML IJ SOLN: 50.0000 mg | Freq: Once | INTRAMUSCULAR | Status: DC | PRN

## 2020-08-29 MED ORDER — METHYLPREDNISOLONE SODIUM SUCC 125 MG IJ SOLR: 125.0000 mg | Freq: Once | INTRAMUSCULAR | Status: DC | PRN

## 2020-08-29 MED ORDER — FAMOTIDINE IN NACL 20-0.9 MG/50ML-% IV SOLN: 20.0000 mg | Freq: Once | INTRAVENOUS | Status: DC | PRN

## 2020-08-29 MED ORDER — SODIUM CHLORIDE 0.9 % IV SOLN: Freq: Once | INTRAVENOUS | Status: AC

## 2020-08-29 MED ORDER — EPINEPHRINE 0.3 MG/0.3ML IJ SOAJ
0.3000 mg | Freq: Once | INTRAMUSCULAR | Status: DC | PRN
Start: 2020-08-29 — End: 2020-08-30

## 2020-08-29 MED ORDER — SODIUM CHLORIDE 0.9 % IV SOLN: INTRAVENOUS | Status: DC | PRN

## 2020-08-29 MED ORDER — ALBUTEROL SULFATE HFA 108 (90 BASE) MCG/ACT IN AERS: 2.0000 | INHALATION_SPRAY | Freq: Once | RESPIRATORY_TRACT | Status: DC | PRN

## 2020-08-29 NOTE — Discharge Instructions (Signed)

## 2020-08-29 NOTE — Progress Notes (Signed)
  Diagnosis: COVID-19  Physician: Dr. Joya Gaskins  Procedure: [facility] Infusion Clinic Med: bamlanivimab\etesevimab infusion - Provided patient with bamlanimivab\etesevimab fact sheet for patients, parents and caregivers prior to infusion.  Complications: No immediate complications noted.  Discharge: Discharged home   Cheri Guppy 08/29/2020

## 2021-02-14 ENCOUNTER — Other Ambulatory Visit: Payer: Self-pay | Admitting: Obstetrics and Gynecology

## 2021-02-14 DIAGNOSIS — Z1231 Encounter for screening mammogram for malignant neoplasm of breast: Secondary | ICD-10-CM

## 2021-02-22 ENCOUNTER — Other Ambulatory Visit: Payer: Self-pay

## 2021-02-22 ENCOUNTER — Ambulatory Visit
Admission: RE | Admit: 2021-02-22 | Discharge: 2021-02-22 | Disposition: A | Discharge status: Home / Self Care | Payer: BC Managed Care – PPO | Source: Ambulatory Visit | Attending: Obstetrics and Gynecology | Admitting: Obstetrics and Gynecology

## 2021-02-22 DIAGNOSIS — Z1231 Encounter for screening mammogram for malignant neoplasm of breast: Secondary | ICD-10-CM

## 2021-03-18 ENCOUNTER — Ambulatory Visit: Payer: BC Managed Care – PPO | Admitting: Cardiovascular Disease

## 2022-04-27 ENCOUNTER — Other Ambulatory Visit: Payer: Self-pay | Admitting: Obstetrics and Gynecology

## 2022-04-27 DIAGNOSIS — Z1231 Encounter for screening mammogram for malignant neoplasm of breast: Secondary | ICD-10-CM

## 2022-05-11 ENCOUNTER — Ambulatory Visit
Admission: RE | Admit: 2022-05-11 | Discharge: 2022-05-11 | Disposition: A | Discharge status: Home / Self Care | Payer: BC Managed Care – PPO | Source: Ambulatory Visit | Attending: Obstetrics and Gynecology | Admitting: Obstetrics and Gynecology

## 2022-05-11 DIAGNOSIS — Z1231 Encounter for screening mammogram for malignant neoplasm of breast: Secondary | ICD-10-CM

## 2022-05-15 ENCOUNTER — Other Ambulatory Visit: Payer: Self-pay | Admitting: Obstetrics and Gynecology

## 2022-05-15 DIAGNOSIS — R928 Other abnormal and inconclusive findings on diagnostic imaging of breast: Secondary | ICD-10-CM

## 2022-05-22 ENCOUNTER — Ambulatory Visit: Payer: BC Managed Care – PPO

## 2022-05-22 ENCOUNTER — Ambulatory Visit
Admission: RE | Admit: 2022-05-22 | Discharge: 2022-05-22 | Disposition: A | Discharge status: Home / Self Care | Payer: BC Managed Care – PPO | Source: Ambulatory Visit | Attending: Obstetrics and Gynecology | Admitting: Obstetrics and Gynecology

## 2022-05-22 DIAGNOSIS — R928 Other abnormal and inconclusive findings on diagnostic imaging of breast: Secondary | ICD-10-CM

## 2022-09-19 ENCOUNTER — Ambulatory Visit (INDEPENDENT_AMBULATORY_CARE_PROVIDER_SITE_OTHER): Payer: BC Managed Care – PPO

## 2022-09-19 ENCOUNTER — Other Ambulatory Visit: Payer: Self-pay | Admitting: Family Medicine

## 2022-09-19 DIAGNOSIS — R1011 Right upper quadrant pain: Secondary | ICD-10-CM

## 2022-09-19 DIAGNOSIS — R110 Nausea: Secondary | ICD-10-CM | POA: Diagnosis not present

## 2023-04-03 ENCOUNTER — Other Ambulatory Visit: Payer: Self-pay | Admitting: Obstetrics and Gynecology

## 2023-04-03 DIAGNOSIS — Z1231 Encounter for screening mammogram for malignant neoplasm of breast: Secondary | ICD-10-CM

## 2023-05-21 ENCOUNTER — Ambulatory Visit
Admission: RE | Admit: 2023-05-21 | Discharge: 2023-05-21 | Disposition: A | Discharge status: Home / Self Care | Payer: BC Managed Care – PPO | Source: Ambulatory Visit | Attending: Obstetrics and Gynecology | Admitting: Obstetrics and Gynecology

## 2023-05-21 DIAGNOSIS — Z1231 Encounter for screening mammogram for malignant neoplasm of breast: Secondary | ICD-10-CM

## 2023-10-25 ENCOUNTER — Other Ambulatory Visit: Payer: Self-pay | Admitting: Family Medicine

## 2023-10-25 DIAGNOSIS — E785 Hyperlipidemia, unspecified: Secondary | ICD-10-CM

## 2023-11-01 ENCOUNTER — Ambulatory Visit (INDEPENDENT_AMBULATORY_CARE_PROVIDER_SITE_OTHER): Payer: Self-pay

## 2023-11-01 DIAGNOSIS — E785 Hyperlipidemia, unspecified: Secondary | ICD-10-CM

## 2024-02-13 ENCOUNTER — Other Ambulatory Visit (HOSPITAL_COMMUNITY): Payer: Self-pay | Admitting: Family Medicine

## 2024-02-13 DIAGNOSIS — R911 Solitary pulmonary nodule: Secondary | ICD-10-CM

## 2024-02-19 ENCOUNTER — Ambulatory Visit (HOSPITAL_COMMUNITY)
Admission: RE | Admit: 2024-02-19 | Discharge: 2024-02-19 | Disposition: A | Discharge status: Home / Self Care | Payer: Self-pay | Source: Ambulatory Visit | Attending: Family Medicine | Admitting: Family Medicine

## 2024-02-19 DIAGNOSIS — R911 Solitary pulmonary nodule: Secondary | ICD-10-CM | POA: Insufficient documentation

## 2024-05-14 ENCOUNTER — Other Ambulatory Visit: Payer: Self-pay | Admitting: Obstetrics and Gynecology

## 2024-05-14 DIAGNOSIS — Z1231 Encounter for screening mammogram for malignant neoplasm of breast: Secondary | ICD-10-CM

## 2024-05-21 ENCOUNTER — Ambulatory Visit
Admission: RE | Admit: 2024-05-21 | Discharge: 2024-05-21 | Disposition: A | Discharge status: Home / Self Care | Source: Ambulatory Visit | Attending: Obstetrics and Gynecology | Admitting: Obstetrics and Gynecology

## 2024-05-21 DIAGNOSIS — Z1231 Encounter for screening mammogram for malignant neoplasm of breast: Secondary | ICD-10-CM

## 2024-05-27 ENCOUNTER — Other Ambulatory Visit: Payer: Self-pay | Admitting: Obstetrics and Gynecology

## 2024-05-27 DIAGNOSIS — R928 Other abnormal and inconclusive findings on diagnostic imaging of breast: Secondary | ICD-10-CM

## 2024-06-04 ENCOUNTER — Ambulatory Visit
Admission: RE | Admit: 2024-06-04 | Discharge: 2024-06-04 | Disposition: A | Discharge status: Home / Self Care | Source: Ambulatory Visit | Attending: Obstetrics and Gynecology | Admitting: Obstetrics and Gynecology

## 2024-06-04 ENCOUNTER — Other Ambulatory Visit: Payer: Self-pay | Admitting: Obstetrics and Gynecology

## 2024-06-04 DIAGNOSIS — R928 Other abnormal and inconclusive findings on diagnostic imaging of breast: Secondary | ICD-10-CM

## 2024-06-04 DIAGNOSIS — N6320 Unspecified lump in the left breast, unspecified quadrant: Secondary | ICD-10-CM

## 2024-06-13 ENCOUNTER — Ambulatory Visit
Admission: RE | Admit: 2024-06-13 | Discharge: 2024-06-13 | Disposition: A | Discharge status: Home / Self Care | Source: Ambulatory Visit | Attending: Obstetrics and Gynecology | Admitting: Obstetrics and Gynecology

## 2024-06-13 DIAGNOSIS — R928 Other abnormal and inconclusive findings on diagnostic imaging of breast: Secondary | ICD-10-CM

## 2024-06-13 DIAGNOSIS — N6320 Unspecified lump in the left breast, unspecified quadrant: Secondary | ICD-10-CM

## 2024-06-13 HISTORY — PX: BREAST BIOPSY: SHX20

## 2024-06-16 LAB — SURGICAL PATHOLOGY

## 2024-11-11 ENCOUNTER — Other Ambulatory Visit: Payer: Self-pay | Admitting: Obstetrics and Gynecology

## 2024-11-11 ENCOUNTER — Encounter: Payer: Self-pay | Admitting: Obstetrics and Gynecology

## 2024-11-11 DIAGNOSIS — N6489 Other specified disorders of breast: Secondary | ICD-10-CM

## 2024-12-10 ENCOUNTER — Encounter

## 2024-12-10 ENCOUNTER — Other Ambulatory Visit

## 2024-12-25 ENCOUNTER — Other Ambulatory Visit

## 2024-12-25 ENCOUNTER — Encounter
